# Patient Record
Sex: Female | Born: 1935 | Race: White | Hispanic: No | Marital: Single | Smoking: Former smoker
Health system: Southern US, Community
[De-identification: ages and names within clinical notes are randomized; demographics above are authoritative.]

## PROBLEM LIST (undated history)

## (undated) DIAGNOSIS — E119 Type 2 diabetes mellitus without complications: Secondary | ICD-10-CM

---

## 2016-03-17 NOTE — Nursing Note (Signed)
Nursing Discharge Summary - Text       Nursing Discharge Summary Entered On:  03/17/2016 16:28 EST    Performed On:  03/17/2016 16:27 EST by Daphine DeutscherMARTIN, RN, Apolinar JunesBRANDON               DC Information   Discharge To, Anticipated :   Home with family support   Mode of Discharge :   Ambulatory   Transportation :   Cab   Accompanied By :   Family member   Daphine DeutscherMARTIN, RN, Apolinar JunesBRANDON - 03/17/2016 16:27 EST

## 2021-01-30 ENCOUNTER — Inpatient Hospital Stay
Admission: EM | Admit: 2021-01-30 | Discharge: 2021-02-01 | DRG: 638 | Disposition: A | Discharge status: Skilled Nursing Facility | Payer: Medicare Other | Source: Skilled Nursing Facility | Attending: Internal Medicine | Admitting: Internal Medicine

## 2021-01-30 ENCOUNTER — Inpatient Hospital Stay: Payer: Medicare Other

## 2021-01-30 ENCOUNTER — Other Ambulatory Visit: Payer: Self-pay

## 2021-01-30 DIAGNOSIS — Z8249 Family history of ischemic heart disease and other diseases of the circulatory system: Secondary | ICD-10-CM

## 2021-01-30 DIAGNOSIS — E785 Hyperlipidemia, unspecified: Secondary | ICD-10-CM

## 2021-01-30 DIAGNOSIS — E782 Mixed hyperlipidemia: Secondary | ICD-10-CM

## 2021-01-30 DIAGNOSIS — Z79899 Other long term (current) drug therapy: Secondary | ICD-10-CM

## 2021-01-30 DIAGNOSIS — Z87891 Personal history of nicotine dependence: Secondary | ICD-10-CM | POA: Diagnosis not present

## 2021-01-30 DIAGNOSIS — R778 Other specified abnormalities of plasma proteins: Secondary | ICD-10-CM

## 2021-01-30 DIAGNOSIS — Z794 Long term (current) use of insulin: Secondary | ICD-10-CM

## 2021-01-30 DIAGNOSIS — F419 Anxiety disorder, unspecified: Secondary | ICD-10-CM | POA: Diagnosis present

## 2021-01-30 DIAGNOSIS — E1110 Type 2 diabetes mellitus with ketoacidosis without coma: Secondary | ICD-10-CM | POA: Diagnosis present

## 2021-01-30 DIAGNOSIS — E039 Hypothyroidism, unspecified: Secondary | ICD-10-CM | POA: Diagnosis present

## 2021-01-30 DIAGNOSIS — E871 Hypo-osmolality and hyponatremia: Secondary | ICD-10-CM | POA: Diagnosis present

## 2021-01-30 DIAGNOSIS — F32A Depression, unspecified: Secondary | ICD-10-CM | POA: Diagnosis present

## 2021-01-30 DIAGNOSIS — R7989 Other specified abnormal findings of blood chemistry: Secondary | ICD-10-CM | POA: Diagnosis present

## 2021-01-30 DIAGNOSIS — E1010 Type 1 diabetes mellitus with ketoacidosis without coma: Secondary | ICD-10-CM | POA: Diagnosis not present

## 2021-01-30 DIAGNOSIS — Z20822 Contact with and (suspected) exposure to covid-19: Secondary | ICD-10-CM | POA: Diagnosis present

## 2021-01-30 DIAGNOSIS — F0390 Unspecified dementia without behavioral disturbance: Secondary | ICD-10-CM | POA: Diagnosis present

## 2021-01-30 DIAGNOSIS — Z66 Do not resuscitate: Secondary | ICD-10-CM | POA: Diagnosis present

## 2021-01-30 DIAGNOSIS — I10 Essential (primary) hypertension: Secondary | ICD-10-CM

## 2021-01-30 DIAGNOSIS — J439 Emphysema, unspecified: Secondary | ICD-10-CM

## 2021-01-30 DIAGNOSIS — G4700 Insomnia, unspecified: Secondary | ICD-10-CM | POA: Diagnosis present

## 2021-01-30 DIAGNOSIS — J449 Chronic obstructive pulmonary disease, unspecified: Secondary | ICD-10-CM | POA: Diagnosis present

## 2021-01-30 DIAGNOSIS — N179 Acute kidney failure, unspecified: Secondary | ICD-10-CM | POA: Diagnosis present

## 2021-01-30 HISTORY — DX: Type 2 diabetes mellitus without complications: E11.9

## 2021-01-30 LAB — BASIC METABOLIC PANEL
Anion gap: 23 — ABNORMAL HIGH (ref 5–15)
Anion gap: 22 — ABNORMAL HIGH (ref 5–15)
Anion gap: 16 — ABNORMAL HIGH (ref 5–15)
Anion gap: 9 (ref 5–15)
Anion gap: 9 (ref 5–15)
BUN: 48 mg/dL — ABNORMAL HIGH (ref 8–23)
BUN: 47 mg/dL — ABNORMAL HIGH (ref 8–23)
BUN: 43 mg/dL — ABNORMAL HIGH (ref 8–23)
BUN: 39 mg/dL — ABNORMAL HIGH (ref 8–23)
BUN: 43 mg/dL — ABNORMAL HIGH (ref 8–23)
CO2: 12 mmol/L — ABNORMAL LOW (ref 22–32)
CO2: 13 mmol/L — ABNORMAL LOW (ref 22–32)
CO2: 17 mmol/L — ABNORMAL LOW (ref 22–32)
CO2: 20 mmol/L — ABNORMAL LOW (ref 22–32)
CO2: 23 mmol/L (ref 22–32)
Calcium: 8.3 mg/dL — ABNORMAL LOW (ref 8.9–10.3)
Calcium: 8.2 mg/dL — ABNORMAL LOW (ref 8.9–10.3)
Calcium: 7.7 mg/dL — ABNORMAL LOW (ref 8.9–10.3)
Calcium: 6.6 mg/dL — ABNORMAL LOW (ref 8.9–10.3)
Calcium: 7.8 mg/dL — ABNORMAL LOW (ref 8.9–10.3)
Chloride: 94 mmol/L — ABNORMAL LOW (ref 98–111)
Chloride: 95 mmol/L — ABNORMAL LOW (ref 98–111)
Chloride: 98 mmol/L (ref 98–111)
Chloride: 106 mmol/L (ref 98–111)
Chloride: 102 mmol/L (ref 98–111)
Creatinine, Ser: 2.9 mg/dL — ABNORMAL HIGH (ref 0.44–1.00)
Creatinine, Ser: 2.89 mg/dL — ABNORMAL HIGH (ref 0.44–1.00)
Creatinine, Ser: 2.44 mg/dL — ABNORMAL HIGH (ref 0.44–1.00)
Creatinine, Ser: 1.96 mg/dL — ABNORMAL HIGH (ref 0.44–1.00)
Creatinine, Ser: 2.27 mg/dL — ABNORMAL HIGH (ref 0.44–1.00)
GFR, Estimated: 15 mL/min — ABNORMAL LOW (ref >60)
GFR, Estimated: 15 mL/min — ABNORMAL LOW (ref >60)
GFR, Estimated: 18 mL/min — ABNORMAL LOW (ref >60)
GFR, Estimated: 24 mL/min — ABNORMAL LOW (ref >60)
GFR, Estimated: 20 mL/min — ABNORMAL LOW (ref >60)
Glucose, Bld: 765 mg/dL (ref 70–99)
Glucose, Bld: 722 mg/dL (ref 70–99)
Glucose, Bld: 497 mg/dL — ABNORMAL HIGH (ref 70–99)
Glucose, Bld: 310 mg/dL — ABNORMAL HIGH (ref 70–99)
Glucose, Bld: 177 mg/dL — ABNORMAL HIGH (ref 70–99)
Potassium: 4.5 mmol/L (ref 3.5–5.1)
Potassium: 4.3 mmol/L (ref 3.5–5.1)
Potassium: 4.4 mmol/L (ref 3.5–5.1)
Potassium: 3.5 mmol/L (ref 3.5–5.1)
Potassium: 4.3 mmol/L (ref 3.5–5.1)
Sodium: 129 mmol/L — ABNORMAL LOW (ref 135–145)
Sodium: 130 mmol/L — ABNORMAL LOW (ref 135–145)
Sodium: 131 mmol/L — ABNORMAL LOW (ref 135–145)
Sodium: 135 mmol/L (ref 135–145)
Sodium: 134 mmol/L — ABNORMAL LOW (ref 135–145)

## 2021-01-30 LAB — BETA-HYDROXYBUTYRIC ACID
Beta-Hydroxybutyric Acid: 6.18 mmol/L — ABNORMAL HIGH (ref 0.05–0.27)
Beta-Hydroxybutyric Acid: 0.3 mmol/L — ABNORMAL HIGH (ref 0.05–0.27)

## 2021-01-30 LAB — URINALYSIS, COMPLETE (UACMP) WITH MICROSCOPIC
Bacteria, UA: NONE SEEN
Bilirubin Urine: NEGATIVE
Glucose, UA: >=500 mg/dL — AB
Ketones, ur: 20 mg/dL — AB
Leukocytes,Ua: NEGATIVE
Nitrite: NEGATIVE
Protein, ur: 100 mg/dL — AB
Specific Gravity, Urine: 1.017 (ref 1.005–1.030)
pH: 5 (ref 5.0–8.0)

## 2021-01-30 LAB — BLOOD GAS, VENOUS
Acid-base deficit: 14.7 mmol/L — ABNORMAL HIGH (ref 0.0–2.0)
Bicarbonate: 12.8 mmol/L — ABNORMAL LOW (ref 20.0–28.0)
O2 Saturation: 63.6 %
Patient temperature: 37
pCO2, Ven: 35 mmHg — ABNORMAL LOW (ref 44.0–60.0)
pH, Ven: 7.17 — CL (ref 7.250–7.430)
pO2, Ven: 43 mmHg (ref 32.0–45.0)

## 2021-01-30 LAB — HEPATIC FUNCTION PANEL
ALT: 15 U/L (ref 0–44)
AST: 27 U/L (ref 15–41)
Albumin: 3.7 g/dL (ref 3.5–5.0)
Alkaline Phosphatase: 80 U/L (ref 38–126)
Bilirubin, Direct: <0.1 mg/dL (ref 0.0–0.2)
Total Bilirubin: 1.6 mg/dL — ABNORMAL HIGH (ref 0.3–1.2)
Total Protein: 6.7 g/dL (ref 6.5–8.1)

## 2021-01-30 LAB — CBG MONITORING, ED
Glucose-Capillary: >600 mg/dL (ref 70–99)
Glucose-Capillary: >600 mg/dL (ref 70–99)
Glucose-Capillary: >600 mg/dL (ref 70–99)
Glucose-Capillary: 488 mg/dL — ABNORMAL HIGH (ref 70–99)
Glucose-Capillary: 411 mg/dL — ABNORMAL HIGH (ref 70–99)
Glucose-Capillary: 414 mg/dL — ABNORMAL HIGH (ref 70–99)
Glucose-Capillary: 361 mg/dL — ABNORMAL HIGH (ref 70–99)
Glucose-Capillary: 255 mg/dL — ABNORMAL HIGH (ref 70–99)
Glucose-Capillary: 200 mg/dL — ABNORMAL HIGH (ref 70–99)
Glucose-Capillary: 184 mg/dL — ABNORMAL HIGH (ref 70–99)
Glucose-Capillary: 161 mg/dL — ABNORMAL HIGH (ref 70–99)

## 2021-01-30 LAB — RESP PANEL BY RT-PCR (FLU A&B, COVID) ARPGX2
Influenza A by PCR: NEGATIVE
Influenza B by PCR: NEGATIVE
SARS Coronavirus 2 by RT PCR: NEGATIVE

## 2021-01-30 LAB — CBC
HCT: 35.9 % — ABNORMAL LOW (ref 36.0–46.0)
Hemoglobin: 11.8 g/dL — ABNORMAL LOW (ref 12.0–15.0)
MCH: 29.8 pg (ref 26.0–34.0)
MCHC: 32.9 g/dL (ref 30.0–36.0)
MCV: 90.7 fL (ref 80.0–100.0)
Platelets: 255 10*3/uL (ref 150–400)
RBC: 3.96 MIL/uL (ref 3.87–5.11)
RDW: 13 % (ref 11.5–15.5)
WBC: 12.9 10*3/uL — ABNORMAL HIGH (ref 4.0–10.5)
nRBC: 0 % (ref 0.0–0.2)

## 2021-01-30 LAB — LIPASE, BLOOD: Lipase: 45 U/L (ref 11–51)

## 2021-01-30 LAB — LACTIC ACID, PLASMA
Lactic Acid, Venous: 4.3 mmol/L (ref 0.5–1.9)
Lactic Acid, Venous: 1.3 mmol/L (ref 0.5–1.9)

## 2021-01-30 LAB — TROPONIN I (HIGH SENSITIVITY)
Troponin I (High Sensitivity): 21 ng/L — ABNORMAL HIGH (ref <18)
Troponin I (High Sensitivity): 26 ng/L — ABNORMAL HIGH (ref <18)

## 2021-01-30 LAB — TSH: TSH: 3.697 u[IU]/mL (ref 0.350–4.500)

## 2021-01-30 MED ORDER — MEMANTINE HCL 5 MG PO TABS
10.0000 mg | ORAL_TABLET | Freq: Two times a day (BID) | ORAL | Status: DC
  Administered 2021-01-30 – 2021-02-01 (×4): 10 mg via ORAL
  Filled 2021-01-30 (×4): qty 2

## 2021-01-30 MED ORDER — INSULIN ASPART 100 UNIT/ML IJ SOLN
0.0000 [IU] | Freq: Three times a day (TID) | INTRAMUSCULAR | Status: DC
  Administered 2021-01-31: 3 [IU] via SUBCUTANEOUS
  Administered 2021-01-31: 2 [IU] via SUBCUTANEOUS
  Administered 2021-01-31: 3 [IU] via SUBCUTANEOUS
  Administered 2021-01-31: 2 [IU] via SUBCUTANEOUS
  Filled 2021-01-30 (×4): qty 1

## 2021-01-30 MED ORDER — LACTATED RINGERS IV BOLUS
1000.0000 mL | Freq: Once | INTRAVENOUS | Status: AC
  Administered 2021-01-30: 1000 mL via INTRAVENOUS

## 2021-01-30 MED ORDER — SODIUM CHLORIDE 0.9 % IV SOLN: INTRAVENOUS | Status: DC

## 2021-01-30 MED ORDER — LEVOTHYROXINE SODIUM 50 MCG PO TABS
125.0000 ug | ORAL_TABLET | Freq: Every day | ORAL | Status: DC
  Administered 2021-01-31 – 2021-02-01 (×2): 125 ug via ORAL
  Filled 2021-01-30: qty 1
  Filled 2021-01-30: qty 3

## 2021-01-30 MED ORDER — HYDRALAZINE HCL 20 MG/ML IJ SOLN
5.0000 mg | Freq: Four times a day (QID) | INTRAMUSCULAR | Status: DC | PRN
Start: 2021-01-30 — End: 2021-02-01

## 2021-01-30 MED ORDER — INSULIN GLARGINE-YFGN 100 UNIT/ML ~~LOC~~ SOLN
10.0000 [IU] | Freq: Every day | SUBCUTANEOUS | Status: DC
  Administered 2021-01-31 (×2): 10 [IU] via SUBCUTANEOUS
  Filled 2021-01-30 (×3): qty 0.1

## 2021-01-30 MED ORDER — SERTRALINE HCL 50 MG PO TABS
25.0000 mg | ORAL_TABLET | Freq: Every day | ORAL | Status: DC
  Administered 2021-01-30 – 2021-01-31 (×2): 25 mg via ORAL
  Filled 2021-01-30 (×2): qty 1

## 2021-01-30 MED ORDER — DEXTROSE IN LACTATED RINGERS 5 % IV SOLN: INTRAVENOUS | Status: DC

## 2021-01-30 MED ORDER — INSULIN REGULAR(HUMAN) IN NACL 100-0.9 UT/100ML-% IV SOLN
INTRAVENOUS | Status: DC
  Administered 2021-01-30: 6 [IU]/h via INTRAVENOUS
  Filled 2021-01-30: qty 100

## 2021-01-30 MED ORDER — SODIUM CHLORIDE 0.9 % IV BOLUS
1000.0000 mL | Freq: Once | INTRAVENOUS | Status: AC
  Administered 2021-01-31: 1000 mL via INTRAVENOUS

## 2021-01-30 MED ORDER — DEXTROSE 50 % IV SOLN: 0.0000 mL | INTRAVENOUS | Status: DC | PRN

## 2021-01-30 MED ORDER — LACTATED RINGERS IV SOLN: INTRAVENOUS | Status: DC

## 2021-01-30 MED ORDER — SENNOSIDES-DOCUSATE SODIUM 8.6-50 MG PO TABS
1.0000 | ORAL_TABLET | Freq: Two times a day (BID) | ORAL | Status: DC
  Administered 2021-01-30 – 2021-02-01 (×4): 1 via ORAL
  Filled 2021-01-30 (×4): qty 1

## 2021-01-30 MED ORDER — HEPARIN SODIUM (PORCINE) 5000 UNIT/ML IJ SOLN
5000.0000 [IU] | Freq: Three times a day (TID) | INTRAMUSCULAR | Status: DC
  Administered 2021-01-30 – 2021-02-01 (×6): 5000 [IU] via SUBCUTANEOUS
  Filled 2021-01-30 (×6): qty 1

## 2021-01-30 MED ORDER — POTASSIUM CHLORIDE 10 MEQ/100ML IV SOLN
10.0000 meq | INTRAVENOUS | Status: AC
Start: 2021-01-30 — End: 2021-01-30
  Administered 2021-01-30: 10 meq via INTRAVENOUS
  Filled 2021-01-30: qty 100

## 2021-01-30 MED ORDER — DONEPEZIL HCL 5 MG PO TABS
10.0000 mg | ORAL_TABLET | Freq: Every day | ORAL | Status: DC
  Administered 2021-01-30 – 2021-01-31 (×2): 10 mg via ORAL
  Filled 2021-01-30 (×3): qty 2

## 2021-01-30 MED ORDER — MELATONIN 5 MG PO TABS
10.0000 mg | ORAL_TABLET | Freq: Every day | ORAL | Status: DC
  Administered 2021-01-30 – 2021-01-31 (×2): 10 mg via ORAL
  Filled 2021-01-30 (×2): qty 2

## 2021-01-30 NOTE — ED Triage Notes (Signed)
Pt comes into the ED via EMS from [facility] with c/o CBG reading HIGH, they gave her 15 lantus and then 16 of novolog and it was still reading HIGH Pt c/o weakness, chills and vomiting today. Fine per staff yesterday #[id number] NS infusing 156/98 98%RA 99HR

## 2021-01-30 NOTE — ED Notes (Signed)
Called lab to verify Beta hydroxy levels for this pt , no results found in Epic . No results given from lab, though orders noted completed.. will send sample to lab

## 2021-01-30 NOTE — ED Notes (Signed)
Pt ambulatory to toilet in rm with this tech and Melanie, EDT at her side. Pt urinated at this time. Hand hygiene preformed. Pt ambulatory back to bed and placed back on cardiac monitor. Pt has no further needs at this time. Call light within reach.

## 2021-01-30 NOTE — ED Provider Notes (Signed)
Emergency Medicine Provider Triage Evaluation Note  Annaclara Search , a 87 y.o. female  was evaluated in triage. She arrives by EMS for high blood sugar. She is unsure why she is here. She denies complaints except feeling thirsty.  Review of Systems  Positive: Increased thirst Negative: Chest pain, shortness of breath  Physical Exam  There were no vitals taken for this visit. Gen:   Awake, no distress   Resp:  Normal effort  MSK:   Moves extremities without difficulty  Other:    Medical Decision Making  Medically screening exam initiated at 1:11 PM.  Appropriate orders placed.  Advita Halliwill was informed that the remainder of the evaluation will be completed by another provider, this initial triage assessment does not replace that evaluation, and the importance of remaining in the ED until their evaluation is complete.   Victorino Dike, FNP 01/30/21 1317    Lucrezia Starch, MD 01/30/21 1409

## 2021-01-30 NOTE — ED Provider Notes (Signed)
[facility] Emergency Department Provider Note  ____________________________________________   Event Date/Time   First MD Initiated Contact with Patient 01/30/21 1311     (approximate)  I have reviewed the triage vital signs and the nursing notes.   HISTORY  Chief Complaint Hyperglycemia   HPI Katelyn Arroyo is a 87 y.o. female with a past medical history of type 1 diabetes, CKD, COPD and HDL who presents EMS from a nursing facility for evaluation of some nonbloody nonbilious nausea and vomiting that started today as well as high sugars.  Patient reportedly received 15 units of Lantus and 16 of NovoLog but her fingerstick glucose was still running high at facility. Received 500s of normal saline via EMS prior to arrival.  Patient states she cannot member when she started feeling poorly but denies any current nausea, chest pain, headache, earache, sore throat, cough, back pain or any other acute sick symptoms.  She has not oriented and further history is somewhat limited secondary to dementia.  I did speak with her daughter and seems he recently moved to the area from [place] patient has had issues with her sugars in the past.  Daughter reports symptoms all started today and she seemed to be in her usual state health yesterday.  She confirms that patient does not normally oriented secondary to dementia.         Past Medical History:  Diagnosis Date   Diabetes mellitus without complication [facility])     Patient Active Problem List   Diagnosis Date Noted   DKA (diabetic ketoacidosis) ([place]) 01/30/2021   Essential hypertension 01/30/2021   Dementia ([place]) 01/30/2021   COPD (chronic obstructive pulmonary disease) ([place]) 01/30/2021   Hyperlipidemia 01/30/2021    History reviewed. No pertinent surgical history.  Prior to Admission medications   Not on File    Allergies Ceftriaxone  No family history on file.  Social History Social History   Tobacco Use    Smoking status: Former    Types: Cigarettes   Smokeless tobacco: Never    Review of Systems  Review of Systems  Unable to perform ROS: Dementia  Gastrointestinal:  Positive for nausea and vomiting.     ____________________________________________   PHYSICAL EXAM:  VITAL SIGNS: ED Triage Vitals  Enc Vitals Group     BP 01/30/21 1315 (!) 140/48     Pulse Rate 01/30/21 1315 91     Resp 01/30/21 1315 16     Temp 01/30/21 1315 97.8 F (36.6 C)     Temp Source 01/30/21 1315 Oral     SpO2 01/30/21 1315 99 %     Weight 01/30/21 1315 150 lb (68 kg)     Height --      Head Circumference --      Peak Flow --      Pain Score 01/30/21 1304 0     Pain Loc --      Pain Edu? --      Excl. in [place]? --    Vitals:   01/30/21 1335 01/30/21 1400  BP: (!) 152/67 (!) 152/70  Pulse: 93 93  Resp: (!) 21 14  Temp:    SpO2: 100% 100%   Physical Exam Vitals and nursing note reviewed.  Constitutional:      General: She is not in acute distress.    Appearance: She is well-developed.  HENT:     Head: Normocephalic and atraumatic.     Right Ear: External ear normal.  Left Ear: External ear normal.     Nose: Nose normal.     Mouth/Throat:     Mouth: Mucous membranes are dry.  Eyes:     Conjunctiva/sclera: Conjunctivae normal.  Cardiovascular:     Rate and Rhythm: Normal rate and regular rhythm.     Heart sounds: No murmur heard. Pulmonary:     Effort: Pulmonary effort is normal. No respiratory distress.     Breath sounds: Normal breath sounds.  Abdominal:     Palpations: Abdomen is soft.     Tenderness: There is no abdominal tenderness.  Musculoskeletal:     Cervical back: Neck supple.  Skin:    General: Skin is warm and dry.     Capillary Refill: Capillary refill takes more than 3 seconds.  Neurological:     Mental Status: She is alert. Mental status is at baseline. She is disoriented and confused.  Psychiatric:        Mood and Affect: Mood normal.    Patient follows  commands in all extremities.  Cranial nerves II through XII grossly intact. ____________________________________________   LABS (all labs ordered are listed, but only abnormal results are displayed)  Labs Reviewed  BASIC METABOLIC PANEL - Abnormal; Notable for the following components:      Result Value   Sodium 129 (*)    Chloride 94 (*)    CO2 12 (*)    Glucose, Bld 765 (*)    BUN 48 (*)    Creatinine, Ser 2.90 (*)    Calcium 8.3 (*)    GFR, Estimated 15 (*)    Anion gap 23 (*)    All other components within normal limits  CBC - Abnormal; Notable for the following components:   WBC 12.9 (*)    Hemoglobin 11.8 (*)    HCT 35.9 (*)    All other components within normal limits  URINALYSIS, COMPLETE (UACMP) WITH MICROSCOPIC - Abnormal; Notable for the following components:   Color, Urine STRAW (*)    APPearance CLEAR (*)    Glucose, UA >=500 (*)    Hgb urine dipstick SMALL (*)    Ketones, ur 20 (*)    Protein, ur 100 (*)    All other components within normal limits  BLOOD GAS, VENOUS - Abnormal; Notable for the following components:   pH, Ven 7.17 (*)    pCO2, Ven 35 (*)    Bicarbonate 12.8 (*)    Acid-base deficit 14.7 (*)    All other components within normal limits  HEPATIC FUNCTION PANEL - Abnormal; Notable for the following components:   Total Bilirubin 1.6 (*)    All other components within normal limits  BASIC METABOLIC PANEL - Abnormal; Notable for the following components:   Sodium 130 (*)    Chloride 95 (*)    CO2 13 (*)    Glucose, Bld 722 (*)    BUN 47 (*)    Creatinine, Ser 2.89 (*)    Calcium 8.2 (*)    GFR, Estimated 15 (*)    Anion gap 22 (*)    All other components within normal limits  CBG MONITORING, ED - Abnormal; Notable for the following components:   Glucose-Capillary >600 (*)    All other components within normal limits  TROPONIN I (HIGH SENSITIVITY) - Abnormal; Notable for the following components:   Troponin I (High Sensitivity) 21 (*)     All other components within normal limits  RESP PANEL BY RT-PCR (FLU A&B, COVID) ARPGX2  LIPASE, BLOOD  BETA-HYDROXYBUTYRIC ACID  LACTIC ACID, PLASMA  LACTIC ACID, PLASMA  BASIC METABOLIC PANEL  BASIC METABOLIC PANEL  BASIC METABOLIC PANEL  BASIC METABOLIC PANEL  BASIC METABOLIC PANEL  BASIC METABOLIC PANEL  BASIC METABOLIC PANEL  BETA-HYDROXYBUTYRIC ACID  BETA-HYDROXYBUTYRIC ACID  HEMOGLOBIN A1C  CBG MONITORING, ED   ____________________________________________  EKG  ECG has significant artifact and appears to be sinus with possible multi focal atrial P waves with abnormal intervals and some nonspecific changes in inferior leads. ____________________________________________  [place]  ED MD interpretation:    Official radiology report(s): No results found.  ____________________________________________   PROCEDURES  Procedure(s) performed (including Critical Care):  .Critical Care Performed by: Lucrezia Starch, MD Authorized by: Lucrezia Starch, MD   Critical care provider statement:    Critical care time (minutes):  45   Critical care was necessary to treat or prevent imminent or life-threatening deterioration of the following conditions:  Endocrine crisis and metabolic crisis   Critical care was time spent personally by me on the following activities:  Discussions with consultants, evaluation of patient's response to treatment, examination of patient, ordering and performing treatments and interventions, ordering and review of laboratory studies, ordering and review of radiographic studies, pulse oximetry, re-evaluation of patient's condition, obtaining history from patient or surrogate and review of old charts   ____________________________________________   INITIAL IMPRESSION / [place] / ED COURSE      Patient presents with above-stated history exam of developing some nausea and vomiting this morning and has appears to glucose measurements  reading "high" but did not improve with her sliding scale insulin and Lantus.  On arrival she is afebrile and hemodynamically stable.  She is confused but has a nonfocal neurological exam.  She does appear dehydrated.  Abdomen is soft nontender throughout and her lungs are clear bilaterally.  "On reassessment she states she is not sure why she is here.  Fingerstick arrival shows glucose of greater than 600.  Differential includes DKA, HHS possible precipitated by acute infectious process i.e. infectious gastroenteritis.  I will obtain an EKG to assess for evidence of atypical presentation for ACS as well.  ECG has some nonspecific findings versus artifact and patient is not endorsing any chest pain at this time we will plan to obtain a troponin.   BMP remarkable for sodium of 129, bicarb of 12, glucose of 765, BUN of 48 and a creatinine of 2.9 with an anion gap of 23.  CBC shows slight leukocytosis with WBC count of 12.9 without evidence of significant anemia and platelets are normal.  Hepatic function panel is unremarkable and there is no evidence of cholestasis or hepatitis and I have low suspicion for cholecystitis the patient has no significant tenderness on exam.  VBG with a pH of 7.17 with a PCO2 of 35 and a bicarb of 12.8 which combined with BMP is consistent with DKA.  This is further supported by UA which is ketones.  No evidence on UA of urinary source of infection.  Lipase not consistent with acute pancreatitis.  Troponin is elevated 2100 suspect represents a mild demand ischemia Evalose patient for ACS at this time.  We will plan to trend this.   Patient started on insulin protocol.  I will plan to admit to medicine service for further evaluation and management.      ____________________________________________   FINAL CLINICAL IMPRESSION(S) / ED DIAGNOSES  Final diagnoses:  Diabetic ketoacidosis without coma associated with type 1 diabetes mellitus (  [place])  Troponin I above reference  range    Medications  insulin regular, human (MYXREDLIN) 100 units/ 100 mL infusion (6 Units/hr Intravenous New Bag/Given 01/30/21 1438)  lactated ringers infusion (has no administration in time range)  dextrose 5 % in lactated ringers infusion (has no administration in time range)  dextrose 50 % solution 0-50 mL (has no administration in time range)  potassium chloride 10 mEq in 100 mL IVPB (10 mEq Intravenous New Bag/Given 01/30/21 1439)  heparin injection 5,000 Units (has no administration in time range)  lactated ringers bolus 1,000 mL (1,000 mLs Intravenous New Bag/Given 01/30/21 1341)     ED Discharge Orders     None        Note:  This document was prepared using Dragon voice recognition software and may include unintentional dictation errors.    Lucrezia Starch, MD 01/30/21 [phone]

## 2021-01-30 NOTE — H&P (Signed)
History and Physical   Katelyn Arroyo [id number] DOB: 01-11-1929 DOA: 01/30/2021  PCP: System, Provider Not In  Patient coming from: [facility]  I have personally briefly reviewed patient's old medical records in [place].  Chief Concern: High blood sugar  HPI: Katelyn Arroyo is a 87 y.o. female with medical history significant for insulin-dependent diabetes mellitus, hypothyroid, depression/anxiety, dementia, hypothyroid, presents to the emergency department for chief concerns of high blood sugar from facility.  At bedside was able to tell me her name, the current location is hospital.  She was not able to tell me her age, current calendar year nor the current president.  She is very pleasant and does not appear to be in acute distress.  She kept asking me why she in the hospital despite multiple answers.  She does note that her only child is Katelyn, her daughter.  She states that she feels fine and denies chest pain, shortness of breath, abdominal pain.  She states that she has not had any thing to eat.  However she states she does not feel hungry.  And then she states that she does not know if she is had anything to eat.  Per daughter, patient has been at [facility] for less than 1 week.  Patient has advanced dementia and her medications are administered by facility staff.   She went out to eat lunch with grandson on 01/29/21 at a [place], patient had a taco salad. Per daughter, patient was in normal state of health and had no complaints.   Social history: She is a former tobacco user, and she quit smoking about 20 years ago. She currently does not drink etoh. She is retired and formerly worked in Conservation officer, historic buildings work.   Vaccination history: She is vaccinated for covid 19, two doses Moderna  ROS: Unable to complete due to patient with advance dementia  ED Course: Discussed with emergency medicine provider, patient requiring hospitalization for chief concerns of  DKA.  Vitals in the emergency department was remarkable for temperature 97.8, respiration rate of 14, heart rate of 93, blood pressure 152/70, SPO2 100% on room air.  Labs in the emergency department was remarkable for sodium 129, potassium 4.5, chloride 93, bicarb 12, BUN 48, serum creatinine of 2.90, nonfasting blood glucose 765, GFR 15, WBC 12.9, hemoglobin 11.8, platelets 255.  COVID/influenza A/influenza B PCR were negative.  High sensitive troponin was 21.  EDP started patient on insulin GTT and lactated ringer 1 L bolus.  Assessment/Plan  Principal Problem:   DKA (diabetic ketoacidosis) ([place]) Active Problems:   Essential hypertension   Dementia (H[facility])   COPD (chronic obstructive pulmonary disease) ([facility])   Hyperlipidemia   Elevated troponin   # DKA with gap of 23 on admission - Etiology work-up in progress - Admit to stepdown, inpatient, telemetry - Continue insulin GTT - Check beta hydroxy butyrate - BMP every 4 hours - Check portable chest x-ray, continue to monitor troponins  # Hyponatremia secondary to hyperglycemia - Correction sodium is 140 - Status post lactated ringer 1 L bolus per EDP - Continue lactated ringer infusion at 125 mL/h until CBG less than 250 - Continue order of dextrose 5% LR at 125 mL/h for CBG less than 250 - Check portable chest x-ray  # Elevated troponin-low clinical suspicion for ACS, I suspect this is secondary to DKA - Patient not have chest pain and there was no ST elevation on EKG - No prior EKG for further comparison  # Acute kidney injury  versus CKD 4 - No prior labs available in EHR and no access to care everywhere - Serum creatinine on presentation is 2.90, GFR 15 - BMP recheck as above  # Mild hypertension-no prior history - Given the patient's age is 54, we will have higher threshold for hypertension - Hydralazine 5 mg IV every 6 hours as needed for SBP greater than 170, 4 doses ordered  # History of dementia - recently  moved to [place] from [place] home memantine 10 mg p.o. twice daily, donepezil 10 mg nightly - Fall precautions  # Depression/anxiety-resumed home sertraline 25 mg p.o. nightly # Insomnia-resumed home melatonin 10 mg nightly  Med reconciliation completed with the aid of a pharmacy tech  Chart reviewed.   DVT prophylaxis: Heparin 5000 units every 8 hours Code Status: DNR/DNI  Diet: N.p.o. Family Communication: Attempted to call daughter, Katelyn Arroyo, at [phone], my call went directly to voicemail Disposition Plan: Pending clinical course Consults called: None at this time Admission status: Stepdown, inpatient, telemetry  Past Medical History:  Diagnosis Date   Diabetes mellitus without complication ([place])    History reviewed. No pertinent surgical history.  Social History:  reports that she has quit smoking. Her smoking use included cigarettes. She has never used smokeless tobacco. No history on file for alcohol use and drug use.  Allergies  Allergen Reactions   Ceftriaxone    Family History  Problem Relation Age of Onset   Heart disease Father    Heart disease Other    Family history: Family history reviewed and not pertinent.  Prior to Admission medications   Memantine 10 mg twice daily, levothyroxine 125 mcg before breakfast daily   Physical Exam: Vitals:   01/30/21 1530 01/30/21 1600 01/30/21 1630 01/30/21 1700  BP: (!) 151/80 (!) 154/75 128/71 (!) 125/57  Pulse: 79 79 88 77  Resp: '18 17 15 18  '$ Temp:      TempSrc:      SpO2: 100% 100% 98% 98%  Weight:       Constitutional: appears age-appropriate, NAD, calm, comfortable Eyes: PERRL, lids and conjunctivae normal ENMT: Mucous membranes are moist. Posterior pharynx clear of any exudate or lesions. Age-appropriate dentition. Hearing appropriate/loss Neck: normal, supple, no masses, no thyromegaly Respiratory: clear to auscultation bilaterally, no wheezing, no crackles. Normal respiratory  effort. No accessory muscle use.  Cardiovascular: Regular rate and rhythm, no murmurs / rubs / gallops. No extremity edema. 2+ pedal pulses. No carotid bruits.  Abdomen: no tenderness, no masses palpated, no hepatosplenomegaly. Bowel sounds positive.  Musculoskeletal: no clubbing / cyanosis. No joint deformity upper and lower extremities. Good ROM, no contractures, no atrophy. Normal muscle tone.  Skin: no rashes, lesions, ulcers. No induration Neurologic: Sensation intact. Strength 5/5 in all 4.  Psychiatric: Patient has judgment and insight in setting of chronic, known dementia. Alert and oriented to self and location. Normal mood.   EKG: independently reviewed, showing sinus rhythm with rate of 98, QTc 455  Chest x-ray on Admission: I personally reviewed and I agree with radiologist reading as below.  Portable chest x-ray (1 view)  Result Date: 01/30/2021 CLINICAL DATA:  Hyperglycemia and DKA EXAM: PORTABLE CHEST 1 VIEW COMPARISON:  None. FINDINGS: Cardiac shadow is within normal limits. Aortic calcifications are noted. Lungs are well aerated bilaterally. Multiple calcified granulomas are seen bilaterally. No focal infiltrate or effusion is noted. Changes of prior vertebral augmentation are seen. IMPRESSION: No acute abnormality noted. Changes of prior granulomatous disease are seen. Electronically  Signed   By: Inez Catalina M.D.   On: 01/30/2021 15:30    Labs on Admission: I have personally reviewed following labs  CBC: Recent Labs  Lab 01/30/21 1314  WBC 12.9*  HGB 11.8*  HCT 35.9*  MCV 90.7  PLT 123456   Basic Metabolic Panel: Recent Labs  Lab 01/30/21 1314 01/30/21 1332 01/30/21 1545  NA 129* 130* 131*  K 4.5 4.3 4.4  CL 94* 95* 98  CO2 12* 13* 17*  GLUCOSE 765* 722* 497*  BUN 48* 47* 43*  CREATININE 2.90* 2.89* 2.44*  CALCIUM 8.3* 8.2* 7.7*   GFR: CrCl cannot be calculated (Unknown ideal weight.).  Liver Function Tests: Recent Labs  Lab 01/30/21 1314  AST 27   ALT 15  ALKPHOS 80  BILITOT 1.6*  PROT 6.7  ALBUMIN 3.7   Recent Labs  Lab 01/30/21 1332  LIPASE 45   CBG: Recent Labs  Lab 01/30/21 1313 01/30/21 1428 01/30/21 1500 01/30/21 1547 01/30/21 1632  GLUCAP >600* >600* >600* 488* 414*   Urine analysis:    Component Value Date/Time   COLORURINE STRAW (A) 01/30/2021 1332   APPEARANCEUR CLEAR (A) 01/30/2021 1332   LABSPEC 1.017 01/30/2021 1332   PHURINE 5.0 01/30/2021 1332   GLUCOSEU >=500 (A) 01/30/2021 1332   HGBUR SMALL (A) 01/30/2021 1332   BILIRUBINUR NEGATIVE 01/30/2021 1332   KETONESUR 20 (A) 01/30/2021 1332   PROTEINUR 100 (A) 01/30/2021 1332   NITRITE NEGATIVE 01/30/2021 1332   LEUKOCYTESUR NEGATIVE 01/30/2021 1332   Dr. Tobie Poet [facility]  If 7PM-7AM, please contact overnight-coverage provider If 7AM-7PM, please contact day coverage provider [email or web address]  01/30/2021, 5:30 PM

## 2021-01-30 NOTE — ED Notes (Signed)
Pt is alert, ambulatory without assistance. Pt is oriented to self only- asks for reorientation frequently. NAD noted. Fruity breath odor noted.

## 2021-01-31 ENCOUNTER — Encounter: Payer: Self-pay | Admitting: Internal Medicine

## 2021-01-31 LAB — BASIC METABOLIC PANEL
Anion gap: 12 (ref 5–15)
BUN: 43 mg/dL — ABNORMAL HIGH (ref 8–23)
CO2: 21 mmol/L — ABNORMAL LOW (ref 22–32)
Calcium: 8.2 mg/dL — ABNORMAL LOW (ref 8.9–10.3)
Chloride: 100 mmol/L (ref 98–111)
Creatinine, Ser: 2.16 mg/dL — ABNORMAL HIGH (ref 0.44–1.00)
GFR, Estimated: 21 mL/min — ABNORMAL LOW (ref >60)
Glucose, Bld: 164 mg/dL — ABNORMAL HIGH (ref 70–99)
Potassium: 4.4 mmol/L (ref 3.5–5.1)
Sodium: 133 mmol/L — ABNORMAL LOW (ref 135–145)

## 2021-01-31 LAB — GLUCOSE, CAPILLARY
Glucose-Capillary: 173 mg/dL — ABNORMAL HIGH (ref 70–99)
Glucose-Capillary: 148 mg/dL — ABNORMAL HIGH (ref 70–99)

## 2021-01-31 LAB — CBG MONITORING, ED
Glucose-Capillary: 128 mg/dL — ABNORMAL HIGH (ref 70–99)
Glucose-Capillary: 129 mg/dL — ABNORMAL HIGH (ref 70–99)
Glucose-Capillary: 149 mg/dL — ABNORMAL HIGH (ref 70–99)
Glucose-Capillary: 153 mg/dL — ABNORMAL HIGH (ref 70–99)
Glucose-Capillary: 163 mg/dL — ABNORMAL HIGH (ref 70–99)

## 2021-01-31 LAB — MAGNESIUM: Magnesium: 1.8 mg/dL (ref 1.7–2.4)

## 2021-01-31 LAB — HEMOGLOBIN A1C
Hgb A1c MFr Bld: 9.1 % — ABNORMAL HIGH (ref 4.8–5.6)
Mean Plasma Glucose: 214 mg/dL

## 2021-01-31 LAB — ALBUMIN: Albumin: 3 g/dL — ABNORMAL LOW (ref 3.5–5.0)

## 2021-01-31 MED ORDER — MAGNESIUM SULFATE IN D5W 1-5 GM/100ML-% IV SOLN
1.0000 g | Freq: Once | INTRAVENOUS | Status: AC
  Administered 2021-01-31: 1 g via INTRAVENOUS
  Filled 2021-01-31: qty 100

## 2021-01-31 NOTE — ED Notes (Signed)
Pt brief slightly wet, clean brief placed on pt and purewick adjusted

## 2021-01-31 NOTE — Progress Notes (Signed)
Inpatient Diabetes Program Recommendations  AACE/ADA: New Consensus Statement on Inpatient Glycemic Control   Target Ranges:  Prepandial:   less than 140 mg/dL      Peak postprandial:   less than 180 mg/dL (1-2 hours)      Critically ill patients:  140 - 180 mg/dL   Results for JAZAYA, HASSER (MRN [id number]) as of 01/31/2021 07:37  Ref. Range 01/31/2021 00:03 01/31/2021 02:05 01/31/2021 06:00  Glucose-Capillary Latest Ref Range: 70 - 99 mg/dL 163 (H)    Semglee 10 units @ 00:47 128 (H) 149 (H)  Novolog 2 units  Results for JUNKO, LONGAKER (MRN [id number]) as of 01/31/2021 07:37  Ref. Range 01/30/2021 13:14  Beta-Hydroxybutyric Acid Latest Ref Range: 0.05 - 0.27 mmol/L 6.18 (H)  Glucose Latest Ref Range: 70 - 99 mg/dL 765 (HH)  Hemoglobin A1C Latest Ref Range: 4.8 - 5.6 % 9.1 (H)   Review of Glycemic Control  Diabetes history: DM Outpatient Diabetes medications: Lantus 10 units daily, Novolog 4-10 units TID with meals Current orders for Inpatient glycemic control: Semglee 10 units QHS, Novolog 0-15 units AC&HS  Inpatient Diabetes Program Recommendations:    Insulin: Patient was initially ordered IV insulin for DKA and has been transitioned to SQ insulin. Patient received Semglee 10 units at 00:47 today. Agree with current insulin orders.  NOTE: Noted consult for diabetes coordinator. Chart reviewed. Per chart, patient is from [place] and has dementia. Patient was brought to the hospital  via EMS for hyperglycemia. Initial glucose 765 mg/dl and IV insulin per Endotool for DKA was started. Patient has been transitioned to SQ insulin and most current glucose 149 mg/dl at 6:00 am today. Agree with current insulin orders. Will follow along while inpatient.  Thanks, Barnie Alderman, RN, MSN, CDE Diabetes Coordinator Inpatient Diabetes Program [phone] (Team Pager from 8am to 5pm)

## 2021-01-31 NOTE — ED Notes (Signed)
Pt awake and does not report any further nausea or vomiting.

## 2021-01-31 NOTE — Progress Notes (Signed)
PROGRESS NOTE    Katelyn Arroyo  [id number] DOB: 04-27-1929 DOA: 01/30/2021 PCP: System, Provider Not In    Brief Narrative: 87 y.o. female with medical history significant for insulin-dependent diabetes mellitus, hypothyroid, depression/anxiety, dementia, hypothyroid, presents to the emergency department for chief concerns of high blood sugar from facility.   At bedside was able to tell me her name, the current location is hospital.  She was not able to tell me her age, current calendar year nor the current president.  She is very pleasant and does not appear to be in acute distress.   She kept asking me why she in the hospital despite multiple answers.  She does note that her only child is Trixie, her daughter.   She states that she feels fine and denies chest pain, shortness of breath, abdominal pain.  She states that she has not had any thing to eat.  However she states she does not feel hungry.  And then she states that she does not know if she is had anything to eat.   Per daughter, patient has been at [facility] for less than 1 week.  Patient has advanced dementia and her medications are administered by facility staff.    She went out to eat lunch with grandson on 01/29/21 at a [place], patient had a taco salad. Per daughter, patient was in normal state of health and had no complaints.   Patient presented with glucose greater than 765 and laboratory data consistent with DKA with elevated anion gap.  Started on insulin gtt. via Endo tool protocol.  Anion gap closed and patient was transitioned to subcutaneous regimen with good result.   Assessment & Plan:   Principal Problem:   DKA (diabetic ketoacidosis) ([place]) Active Problems:   Essential hypertension   Dementia (HCC)   COPD (chronic obstructive pulmonary disease) (HCC)   Hyperlipidemia   Elevated troponin  # DKA with gap of 23 on admission Etiology unclear No evidence of infection DKA resolved with Endo tool  protocol Transition to subcutaneous regimen Plan: Basal bolus subcutaneous regimen Carb modified diet Diabetes coordinator consult    # Hyponatremia secondary to hyperglycemia - Correction sodium is 140 - Status post lactated ringer 1 L bolus per EDP Discontinue IV fluids at this time as patient is tolerating p.o.   # Elevated troponin low clinical suspicion for ACS, I suspect this is secondary to DKA - Patient not have chest pain and there was no ST elevation on EKG - No prior EKG for further comparison   # Acute kidney injury versus CKD 4 - No prior labs available in EHR and no access to care everywhere - Serum creatinine on presentation is 2.90, GFR 15 - Monitor daily BMP   # Mild hypertension-no prior history - Given the patient's age is 54, we will have higher threshold for hypertension - Hydralazine 5 mg IV every 6 hours as needed for SBP greater than 170, 4 doses ordered   # History of dementia - recently moved to [place] from [place] home memantine 10 mg p.o. twice daily, donepezil 10 mg nightly - Fall precautions   # Depression/anxiety-resumed home sertraline 25 mg p.o. nightly # Insomnia-resumed home melatonin 10 mg nightly   DVT prophylaxis: SQ heparin Code Status: DNR Family Communication: Daughter Sela Hilding [phone] on 9/22 Disposition Plan: Status is: Inpatient  Remains inpatient appropriate because:Inpatient level of care appropriate due to severity of illness  Dispo: The patient is from: ALF  Anticipated d/c is to: ALF              Patient currently is not medically stable to d/c.   Difficult to place patient No       Level of care: Med-Surg  Consultants:  None  Procedures:  None  Antimicrobials:  None   Subjective: Seen and examined.  Sleeping but easily aroused.  History limited by underlying dementia.  Objective: Vitals:   01/31/21 1030 01/31/21 1100 01/31/21 1130 01/31/21 1135  BP: (!) 151/67 (!)  167/83 (!) 162/73   Pulse: (!) 54 70 (!) 54 63  Resp: '17 12 12 20  '$ Temp:      TempSrc:      SpO2: 100% 100% 100% 98%  Weight:        Intake/Output Summary (Last 24 hours) at 01/31/2021 1329 Last data filed at 01/31/2021 0849 Gross per 24 hour  Intake 2096.36 ml  Output 450 ml  Net 1646.36 ml   Filed Weights   01/30/21 1315  Weight: 68 kg    Examination:  General exam: No acute distress.  Appears frail Respiratory system: Poor respiratory effort.  Lungs clear.  Room air Cardiovascular system: S1-S2, RRR, no murmurs, no pedal edema Gastrointestinal system: Abdomen is nondistended, soft and nontender. No organomegaly or masses felt. Normal bowel sounds heard. Central nervous system: Sleepy.  Oriented x1.  No focal deficits Extremities: Symmetric 5 x 5 power. Skin: No rashes, lesions or ulcers Psychiatry: Judgement and insight appear impaired. Mood & affect flattened.     Data Reviewed: I have personally reviewed following labs and imaging studies  CBC: Recent Labs  Lab 01/30/21 1314  WBC 12.9*  HGB 11.8*  HCT 35.9*  MCV 90.7  PLT 123456   Basic Metabolic Panel: Recent Labs  Lab 01/30/21 1332 01/30/21 1545 01/30/21 1801 01/30/21 2302 01/31/21 0607  NA 130* 131* 135 134* 133*  K 4.3 4.4 3.5 4.3 4.4  CL 95* 98 106 102 100  CO2 13* 17* 20* 23 21*  GLUCOSE 722* 497* 310* 177* 164*  BUN 47* 43* 39* 43* 43*  CREATININE 2.89* 2.44* 1.96* 2.27* 2.16*  CALCIUM 8.2* 7.7* 6.6* 7.8* 8.2*  MG  --   --   --  1.8  --    GFR: CrCl cannot be calculated (Unknown ideal weight.). Liver Function Tests: Recent Labs  Lab 01/30/21 1314 01/30/21 2302  AST 27  --   ALT 15  --   ALKPHOS 80  --   BILITOT 1.6*  --   PROT 6.7  --   ALBUMIN 3.7 3.0*   Recent Labs  Lab 01/30/21 1332  LIPASE 45   No results for input(s): AMMONIA in the last 168 hours. Coagulation Profile: No results for input(s): INR, PROTIME in the last 168 hours. Cardiac Enzymes: No results for  input(s): CKTOTAL, CKMB, CKMBINDEX, TROPONINI in the last 168 hours. BNP (last 3 results) No results for input(s): PROBNP in the last 8760 hours. HbA1C: Recent Labs    01/30/21 1314  HGBA1C 9.1*   CBG: Recent Labs  Lab 01/31/21 0003 01/31/21 0205 01/31/21 0600 01/31/21 0835 01/31/21 1137  GLUCAP 163* 128* 149* 129* 153*   Lipid Profile: No results for input(s): CHOL, HDL, LDLCALC, TRIG, CHOLHDL, LDLDIRECT in the last 72 hours. Thyroid Function Tests: Recent Labs    01/30/21 1314  TSH 3.697   Anemia Panel: No results for input(s): VITAMINB12, FOLATE, FERRITIN, TIBC, IRON, RETICCTPCT in the last 72 hours. Sepsis Labs: Recent Labs  Lab 01/30/21 1434 01/30/21 2302  LATICACIDVEN 4.3* 1.3    Recent Results (from the past 240 hour(s))  Resp Panel by RT-PCR (Flu A&B, Covid) Nasopharyngeal Swab     Status: None   Collection Time: 01/30/21  1:32 PM   Specimen: Nasopharyngeal Swab; Nasopharyngeal(NP) swabs in vial transport medium  Result Value Ref Range Status   SARS Coronavirus 2 by RT PCR NEGATIVE NEGATIVE Final    Comment: (NOTE) SARS-CoV-2 target nucleic acids are NOT DETECTED.  The SARS-CoV-2 RNA is generally detectable in upper respiratory specimens during the acute phase of infection. The lowest concentration of SARS-CoV-2 viral copies this assay can detect is 138 copies/mL. A negative result does not preclude SARS-Cov-2 infection and should not be used as the sole basis for treatment or other patient management decisions. A negative result may occur with  improper specimen collection/handling, submission of specimen other than nasopharyngeal swab, presence of viral mutation(s) within the areas targeted by this assay, and inadequate number of viral copies(<138 copies/mL). A negative result must be combined with clinical observations, patient history, and epidemiological information. The expected result is Negative.  Fact Sheet for Patients:   EntrepreneurPulse.com.au  Fact Sheet for Healthcare Providers:  IncredibleEmployment.be  This test is no t yet approved or cleared by the [place] FDA and  has been authorized for detection and/or diagnosis of SARS-CoV-2 by FDA under an Emergency Use Authorization (EUA). This EUA will remain  in effect (meaning this test can be used) for the duration of the COVID-19 declaration under Section 564(b)(1) of the Act, 21 U.S.C.section 360bbb-3(b)(1), unless the authorization is terminated  or revoked sooner.       Influenza A by PCR NEGATIVE NEGATIVE Final   Influenza B by PCR NEGATIVE NEGATIVE Final    Comment: (NOTE) The Xpert Xpress SARS-CoV-2/FLU/RSV plus assay is intended as an aid in the diagnosis of influenza from Nasopharyngeal swab specimens and should not be used as a sole basis for treatment. Nasal washings and aspirates are unacceptable for Xpert Xpress SARS-CoV-2/FLU/RSV testing.  Fact Sheet for Patients: EntrepreneurPulse.com.au  Fact Sheet for Healthcare Providers: IncredibleEmployment.be  This test is not yet approved or cleared by the [place] FDA and has been authorized for detection and/or diagnosis of SARS-CoV-2 by FDA under an Emergency Use Authorization (EUA). This EUA will remain in effect (meaning this test can be used) for the duration of the COVID-19 declaration under Section 564(b)(1) of the Act, 21 U.S.C. section 360bbb-3(b)(1), unless the authorization is terminated or revoked.  Performed at [facility], [address], [place], [place] [address]          Radiology Studies: Portable chest x-ray (1 view)  Result Date: 01/30/2021 CLINICAL DATA:  Hyperglycemia and DKA EXAM: PORTABLE CHEST 1 VIEW COMPARISON:  None. FINDINGS: Cardiac shadow is within normal limits. Aortic calcifications are noted. Lungs are well aerated bilaterally. Multiple calcified  granulomas are seen bilaterally. No focal infiltrate or effusion is noted. Changes of prior vertebral augmentation are seen. IMPRESSION: No acute abnormality noted. Changes of prior granulomatous disease are seen. Electronically Signed   By: Inez Catalina M.D.   On: 01/30/2021 15:30        Scheduled Meds:  donepezil  10 mg Oral QHS   heparin  5,000 Units Subcutaneous Q8H   insulin aspart  0-15 Units Subcutaneous TID AC & HS   insulin glargine-yfgn  10 Units Subcutaneous QHS   levothyroxine  125 mcg Oral QAC breakfast   melatonin  10 mg  Oral QHS   memantine  10 mg Oral BID   senna-docusate  1 tablet Oral Q12H   sertraline  25 mg Oral QHS   Continuous Infusions:  sodium chloride 75 mL/hr at 01/31/21 0829     LOS: 1 day    Time spent: 25 minutes    Sidney Ace, MD [facility] Pager [phone]  If 7PM-7AM, please contact night-coverage 01/31/2021, 1:29 PM

## 2021-01-31 NOTE — ED Notes (Signed)
Meal tray at bedside.  

## 2021-01-31 NOTE — Progress Notes (Signed)
RN left voicemail with pts daughter Sela Hilding in an attempt to complete admission questions.

## 2021-02-01 LAB — GLUCOSE, CAPILLARY: Glucose-Capillary: 110 mg/dL — ABNORMAL HIGH (ref 70–99)

## 2021-02-01 NOTE — Evaluation (Signed)
Occupational Therapy Evaluation Patient Details Name: Katelyn Arroyo MRN: [id number] DOB: March 18, 1929 Today's Date: 02/01/2021   History of Present Illness Pt is an 87 y/o F with PMH: IDDM, hypothyroidism, dementia, and depression. Pt presents from facility [facility]) d/t concerns for high blood glucose. Pt adm for DKA.   Clinical Impression   Pt seen for OT evaluation this date in setting of acute hospitalization d/t DKA. Pt's BG normalized today per RN. Pt is somewhat confused/poor historian, but is oriented to self, place and some aspects of time. Pt requires cues for sequencing and safety as well as SUPV for safety with standing ADLs. Demos some decreased balance with fxl mobility and static standing tasks, appears to be seeking UE support, perhaps furniture walks at baseline, but pt is unclear in providing information in this respect. No gross LOB and does not require physical assistance, but some cues for safety awareness. Pt left in chair with all needs met and in reach, RN notified of session contents. Will continue to follow acutely and anticipate that pt would benefit from [facility] g./u in her ALF setting.      Recommendations for follow up therapy are one component of a multi-disciplinary discharge planning process, led by the attending physician.  Recommendations may be updated based on patient status, additional functional criteria and insurance authorization.   Follow Up Recommendations  Home health OT;Supervision - Intermittent    Equipment Recommendations  3 in 1 bedside commode;Other (comment) (single point cane)    Recommendations for Other Services       Precautions / Restrictions Precautions Precautions: Fall Restrictions Weight Bearing Restrictions: No      Mobility Bed Mobility Overal bed mobility: Modified Independent             General bed mobility comments: HOB elevated, increased time    Transfers Overall transfer level: Needs assistance Equipment used:  None Transfers: Sit to/from Stand Sit to Stand: Supervision              Balance Overall balance assessment: Needs assistance   Sitting balance-Leahy Scale: Normal       Standing balance-Leahy Scale: Fair Standing balance comment: definitely appears to be seeking UE support (unclear if she furniture walks at home, she says "maybe"). No gross LOB, but some sway.                           ADL either performed or assessed with clinical judgement   ADL                                         General ADL Comments: Pt requires SUPV for all standing ADLs d/t some decreased standing balance/tolerance at this time, but is otherwise able to physically perform the tasks and only requires minimal cueing for sequencing d/t unfamiliar ADL items.     Vision Patient Visual Report: No change from baseline       Perception     Praxis      Pertinent Vitals/Pain Pain Assessment: No/denies pain     Hand Dominance     Extremity/Trunk Assessment Upper Extremity Assessment Upper Extremity Assessment: Overall WFL for tasks assessed;Generalized weakness (ROM WFL, MMT grossly 4-/5)   Lower Extremity Assessment Lower Extremity Assessment: Overall WFL for tasks assessed;Generalized weakness       Communication Communication Communication: No difficulties   Cognition Arousal/Alertness:  Awake/alert Behavior During Therapy: WFL for tasks assessed/performed Overall Cognitive Status: History of cognitive impairments - at baseline                                 General Comments: Pt is oriented to self, place, some aspects of situation and some aspects of time, but not day/date.   General Comments       Exercises Other Exercises Other Exercises: OT facilitates ed re: role of OT, safety considerations. Pt with moderate reception   Shoulder Instructions      Home Living Family/patient expects to be discharged to:: Assisted living                              Home Equipment: Shower seat   Additional Comments: walk in shower      Prior Functioning/Environment Level of Independence: Needs assistance  Gait / Transfers Assistance Needed: Pt reports no use of cane or walker at baseline. ADL's / Homemaking Assistance Needed: facility assist for IADLs. Pt reports being able to perform ADLs I'ly. She is somewhat of a poor historian although mentating better this date than on admission (according to chart review)            OT Problem List: Decreased activity tolerance;Impaired balance (sitting and/or standing);Decreased safety awareness      OT Treatment/Interventions: Self-care/ADL training;Therapeutic exercise;Therapeutic activities;DME and/or AE instruction    OT Goals(Current goals can be found in the care plan section) Acute Rehab OT Goals Patient Stated Goal: to go home OT Goal Formulation: With patient Time For Goal Achievement: 02/15/21 Potential to Achieve Goals: Good ADL Goals Pt Will Perform Lower Body Bathing: with modified independence Pt Will Perform Lower Body Dressing: with modified independence Pt Will Transfer to Toilet: with modified independence  OT Frequency: Min 1X/week   Barriers to D/C:            Co-evaluation              AM-PAC OT "6 Clicks" Daily Activity     Outcome Measure Help from another person eating meals?: None Help from another person taking care of personal grooming?: None Help from another person toileting, which includes using toliet, bedpan, or urinal?: A Little Help from another person bathing (including washing, rinsing, drying)?: A Little Help from another person to put on and taking off regular upper body clothing?: None Help from another person to put on and taking off regular lower body clothing?: A Little 6 Click Score: 21   End of Session Equipment Utilized During Treatment: Gait belt;Rolling walker Nurse Communication: Mobility  status  Activity Tolerance: Patient tolerated treatment well Patient left: in chair;with call bell/phone within reach  OT Visit Diagnosis: Unsteadiness on feet (R26.81);Muscle weakness (generalized) (M62.81)                Time: 9373-4287 OT Time Calculation (min): 23 min Charges:  OT General Charges $OT Visit: 1 Visit OT Evaluation $OT Eval Low Complexity: 1 Low OT Treatments $Self Care/Home Management : 8-22 mins  Gerrianne Scale, MS, OTR/L ascom [phone] 02/01/21, 9:54 AM

## 2021-02-01 NOTE — Evaluation (Signed)
Physical Therapy Evaluation Patient Details Name: Kinsler Starns MRN: [id number] DOB: 1928/06/25 Today's Date: 02/01/2021  History of Present Illness  Pt is an 87 y/o F with PMH: IDDM, hypothyroidism, dementia, and depression. Pt presents from facility [facility]) d/t concerns for high blood glucose. Pt adm for DKA.  Clinical Impression  Patient seated in recliner beginning of treatment session; alert and oriented to self, location and general situation.  Follows commands, pleasant and cooperative throughout session.  Bilat UE/LE strength and ROM grossly symmetrical and WFL; no focal weakness appreciated.  Able to complete sit/stand, basic transfers and gait (200') with HHA, min assist.  Increased sway noted all planes, limited balance reactions evident (frequently grabbing for walls, furniture throughout distance). Additional gait trial (100', close sup) completed with use of RW; noted improvement in gait symmetry, fluidity and overall safety.  Do recommend continued use of RW with all mobility at this time. Would benefit from skilled PT to address above deficits and promote optimal return to PLOF.; Recommend transition to HHPT upon discharge from acute hospitalization.        Recommendations for follow up therapy are one component of a multi-disciplinary discharge planning process, led by the attending physician.  Recommendations may be updated based on patient status, additional functional criteria and insurance authorization.  Follow Up Recommendations Home health PT    Equipment Recommendations  Rolling walker with 5" wheels    Recommendations for Other Services       Precautions / Restrictions Precautions Precautions: Fall Restrictions Weight Bearing Restrictions: No      Mobility  Bed Mobility               General bed mobility comments: seated in recliner beginning/end of treatment session    Transfers Overall transfer level: Needs assistance   Transfers: Sit to/from  Stand Sit to Stand: Min guard;Supervision         General transfer comment: does require UE support to stabilize  Ambulation/Gait Ambulation/Gait assistance: Min guard Gait Distance (Feet): 200 Feet Assistive device: 1 person hand held assist   Gait velocity: 10' walk time, 10 seconds   General Gait Details: reciprocal stepping pattern, increased sway in all directions; limited balance reactions evident.  Frequently reaching for walls, furniture for stabilization throughout distance  Stairs            Wheelchair Mobility    Modified Rankin (Stroke Patients Only)       Balance Overall balance assessment: Needs assistance Sitting-balance support: No upper extremity supported;Feet supported Sitting balance-Leahy Scale: Good     Standing balance support: No upper extremity supported Standing balance-Leahy Scale: Fair                               Pertinent Vitals/Pain Pain Assessment: No/denies pain    Home Living Family/patient expects to be discharged to:: Assisted living               Home Equipment: Shower seat Additional Comments: walk in shower    Prior Function Level of Independence: Needs assistance   Gait / Transfers Assistance Needed: Pt reports no use of cane or walker at baseline.  ADL's / Homemaking Assistance Needed: facility assist for IADLs. Pt reports being able to perform ADLs I'ly. She is somewhat of a poor historian although mentating better this date than on admission (according to chart review)        Hand Dominance  Extremity/Trunk Assessment   Upper Extremity Assessment Upper Extremity Assessment: Overall WFL for tasks assessed    Lower Extremity Assessment Lower Extremity Assessment: Overall WFL for tasks assessed       Communication   Communication: No difficulties  Cognition Arousal/Alertness: Awake/alert Behavior During Therapy: WFL for tasks assessed/performed Overall Cognitive Status:  History of cognitive impairments - at baseline                                 General Comments: Pt is oriented to self, place, some aspects of situation and some aspects of time, but not day/date.      General Comments      Exercises Other Exercises Other Exercises: 100' with RW, close sup-noted improvement in stepping pattern, gait symmetry, fluidity and overall safety; patietn subjectively voicing improved comfort/confidence with RW.  Do recommend continued use with all mobility upon discharge Other Exercises: Toilet transfer, ambulatory with RW, cga/close sup-min cuing for walker position, use and safety (often attempts to step away from); sit/stand from toilet, close sup with grab bar; standing balance at sink for hand hygiene, close sup.   Assessment/Plan    PT Assessment Patient needs continued PT services  PT Problem List Decreased activity tolerance;Decreased balance;Decreased mobility;Decreased safety awareness       PT Treatment Interventions DME instruction;Gait training;Functional mobility training;Therapeutic exercise;Balance training;Therapeutic activities;Patient/family education;Cognitive remediation    PT Goals (Current goals can be found in the Care Plan section)  Acute Rehab PT Goals Patient Stated Goal: to go home PT Goal Formulation: With patient Time For Goal Achievement: 02/15/21 Potential to Achieve Goals: Good    Frequency Min 2X/week   Barriers to discharge        Co-evaluation               AM-PAC PT "6 Clicks" Mobility  Outcome Measure Help needed turning from your back to your side while in a flat bed without using bedrails?: None Help needed moving from lying on your back to sitting on the side of a flat bed without using bedrails?: None Help needed moving to and from a bed to a chair (including a wheelchair)?: A Little Help needed standing up from a chair using your arms (e.g., wheelchair or bedside chair)?: A Little Help  needed to walk in hospital room?: A Little Help needed climbing 3-5 steps with a railing? : A Little 6 Click Score: 20    End of Session   Activity Tolerance: Patient tolerated treatment well Patient left: in chair;with call bell/phone within reach;with chair alarm set Nurse Communication: Mobility status PT Visit Diagnosis: Difficulty in walking, not elsewhere classified (R26.2)    Time: [id number] PT Time Calculation (min) (ACUTE ONLY): 15 min   Charges:   PT Evaluation $PT Eval Moderate Complexity: 1 Mod PT Treatments $Gait Training: 8-22 mins        Marnesha Gagen H. Owens Shark, PT, DPT, NCS 02/01/21, 2:32 PM [phone]

## 2021-02-01 NOTE — Care Management Important Message (Signed)
Important Message  Patient Details  Name: Cathryne Herbst MRN: [id number] Date of Birth: 1928/10/10   Medicare Important Message Given:  No     Dannette Barbara 02/01/2021, 8:28 AM

## 2021-02-01 NOTE — Plan of Care (Signed)
?  Problem: Education: ?Goal: Knowledge of General Education information will improve ?Description: Including pain rating scale, medication(s)/side effects and non-pharmacologic comfort measures ?Outcome: Not Progressing ?  ?Problem: Health Behavior/Discharge Planning: ?Goal: Ability to manage health-related needs will improve ?Outcome: Progressing ?  ?Problem: Clinical Measurements: ?Goal: Ability to maintain clinical measurements within normal limits will improve ?Outcome: Progressing ?Goal: Will remain free from infection ?Outcome: Progressing ?Goal: Diagnostic test results will improve ?Outcome: Progressing ?Goal: Respiratory complications will improve ?Outcome: Progressing ?Goal: Cardiovascular complication will be avoided ?Outcome: Progressing ?  ?Problem: Activity: ?Goal: Risk for activity intolerance will decrease ?Outcome: Progressing ?  ?Problem: Nutrition: ?Goal: Adequate nutrition will be maintained ?Outcome: Progressing ?  ?Problem: Coping: ?Goal: Level of anxiety will decrease ?Outcome: Progressing ?  ?Problem: Elimination: ?Goal: Will not experience complications related to bowel motility ?Outcome: Progressing ?Goal: Will not experience complications related to urinary retention ?Outcome: Progressing ?  ?Problem: Pain Managment: ?Goal: General experience of comfort will improve ?Outcome: Progressing ?  ?Problem: Safety: ?Goal: Ability to remain free from injury will improve ?Outcome: Progressing ?  ?Problem: Skin Integrity: ?Goal: Risk for impaired skin integrity will decrease ?Outcome: Progressing ?  ?

## 2021-02-01 NOTE — Discharge Summary (Signed)
Physician Discharge Summary  Katelyn Arroyo [id number] DOB: [birth year] DOA: 01/30/2021  PCP: System, Provider Not In  Admit date: 01/30/2021 Discharge date: 02/01/2021  Admitted From: ALF Disposition: ALF, [facility]  Recommendations for Outpatient Follow-up:  Follow up with PCP in 1-2 weeks   Home Health: No Equipment/Devices: None  Discharge Condition: Stable CODE STATUS: DNR Diet recommendation: Carb modified  Brief/Interim Summary: 87 y.o. female with medical history significant for insulin-dependent diabetes mellitus, hypothyroid, depression/anxiety, dementia, hypothyroid, presents to the emergency department for chief concerns of high blood sugar from facility.   At bedside was able to tell me her name, the current location is hospital.  She was not able to tell me her age, current calendar year nor the current president.  She is very pleasant and does not appear to be in acute distress.   She kept asking me why she in the hospital despite multiple answers.  She does note that her only child is Katelyn Arroyo, her daughter.   She states that she feels fine and denies chest pain, shortness of breath, abdominal pain.  She states that she has not had any thing to eat.  However she states she does not feel hungry.  And then she states that she does not know if she is had anything to eat.   Per daughter, patient has been at [facility] for less than 1 week.  Patient has advanced dementia and her medications are administered by facility staff.    She went out to eat lunch with grandson on 01/29/21 at a [place], patient had a taco salad. Per daughter, patient was in normal state of health and had no complaints.    Patient presented with glucose greater than 765 and laboratory data consistent with DKA with elevated anion gap.  Started on insulin gtt. via Endo tool protocol.  Anion gap closed and patient was transitioned to subcutaneous regimen with good result  Sugars remain controlled  after insulin drip discontinued.  Patient placed back on home insulin regimen.  Etiology of DKA is unclear.  Possible medication versus dietary indiscretion.  Will not make any changes at home medication regimen.  Patient stable for discharge back to [facility] assisted living.  Discharge Diagnoses:  Principal Problem:   DKA (diabetic ketoacidosis) ([place]) Active Problems:   Essential hypertension   Dementia (HCC)   COPD (chronic obstructive pulmonary disease) (HCC)   Hyperlipidemia   Elevated troponin  # DKA with gap of 23 on admission Etiology unclear No evidence of infection DKA resolved with Endo tool protocol Transition to subcutaneous regimen Plan: Discharge back to assisted living facility.  No changes made in home insulin regimen.  Suggest follow-up with endocrinology or PCP to determine etiology of recurrent DKA as patient's daughter states that she has been hospitalized multiple times for same complaint.     # Hyponatremia secondary to hyperglycemia Resolved at time of discharge   # Elevated troponin low clinical suspicion for ACS, I suspect this is secondary to DKA - Patient not have chest pain and there was no ST elevation on EKG - No prior EKG for further comparison   # Acute kidney injury versus CKD 4 - No prior labs available in EHR and no access to care everywhere - Serum creatinine on presentation is 2.90, GFR 15 Improved at time of discharge  # Mild hypertension-no prior history - Given the patient's age is 81, we will have higher threshold for hypertension - Hydralazine 5 mg IV every 6 hours as needed for SBP  greater than 170, 4 doses ordered.  Consider outpatient follow-up and initiation of low-dose antihypertensives.  Will not start at time of discharge   # History of dementia - recently moved to [place] from [place] home memantine 10 mg p.o. twice daily, donepezil 10 mg nightly - Fall precautions   # Depression/anxiety-resumed home  sertraline 25 mg p.o. nightly # Insomnia-resumed home melatonin 10 mg nightly  Discharge Instructions  Discharge Instructions     Diet - low sodium heart healthy   Complete by: As directed    Increase activity slowly   Complete by: As directed    Increase activity slowly   Complete by: As directed       Allergies as of 02/01/2021       Reactions   Ceftriaxone         Medication List     TAKE these medications    acetaminophen 325 MG tablet Commonly known as: TYLENOL Take 650 mg by mouth every 6 (six) hours as needed for mild pain.   donepezil 10 MG tablet Commonly known as: ARICEPT Take 10 mg by mouth at bedtime.   insulin aspart 100 UNIT/ML injection Commonly known as: novoLOG Inject 4-10 Units into the skin 3 (three) times daily with meals. Inject 10 units subcutaneously in the morning, 7 units with lunch and 4 units in the evening (give with a snack/meal)   insulin glargine 100 UNIT/ML injection Commonly known as: LANTUS Inject 10 Units into the skin daily.   levothyroxine 125 MCG tablet Commonly known as: SYNTHROID Take 125 mcg by mouth daily before breakfast.   Melatonin 10 MG Tabs Take 10 mg by mouth at bedtime.   memantine 10 MG tablet Commonly known as: NAMENDA Take 10 mg by mouth 2 (two) times daily.   senna-docusate 8.6-50 MG tablet Commonly known as: Senokot-S Take 1 tablet by mouth every 12 (twelve) hours.   sertraline 25 MG tablet Commonly known as: ZOLOFT Take 25 mg by mouth at bedtime.        Allergies  Allergen Reactions   Ceftriaxone     Consultations: None   Procedures/Studies: Portable chest x-ray (1 view)  Result Date: 01/30/2021 CLINICAL DATA:  Hyperglycemia and DKA EXAM: PORTABLE CHEST 1 VIEW COMPARISON:  None. FINDINGS: Cardiac shadow is within normal limits. Aortic calcifications are noted. Lungs are well aerated bilaterally. Multiple calcified granulomas are seen bilaterally. No focal infiltrate or effusion is  noted. Changes of prior vertebral augmentation are seen. IMPRESSION: No acute abnormality noted. Changes of prior granulomatous disease are seen. Electronically Signed   By: Inez Catalina M.D.   On: 01/30/2021 15:30   (Echo, Carotid, EGD, Colonoscopy, ERCP)    Subjective: And examined on day of discharge.  Sitting up in bed eating breakfast.  No apparent distress.  Answers all questions appropriately.  Stable for discharge back to [facility] assisted living.  Discharge Exam: Vitals:   02/01/21 0356 02/01/21 0759  BP: (!) 162/70 (!) 167/65  Pulse: 71 66  Resp: 20   Temp: 97.6 F (36.4 C) 98.1 F (36.7 C)  SpO2: 97% 96%   Vitals:   01/31/21 1937 01/31/21 1938 02/01/21 0356 02/01/21 0759  BP: (!) 181/64 (!) 157/68 (!) 162/70 (!) 167/65  Pulse: 64 63 71 66  Resp: 16  20   Temp: 97.6 F (36.4 C)  97.6 F (36.4 C) 98.1 F (36.7 C)  TempSrc: Oral  Oral Oral  SpO2: 100%  97% 96%  Weight:  General: Pt is alert, awake, not in acute distress Cardiovascular: RRR, S1/S2 +, no rubs, no gallops Respiratory: CTA bilaterally, no wheezing, no rhonchi Abdominal: Soft, NT, ND, bowel sounds + Extremities: no edema, no cyanosis    The results of significant diagnostics from this hospitalization (including imaging, microbiology, ancillary and laboratory) are listed below for reference.     Microbiology: Recent Results (from the past 240 hour(s))  Resp Panel by RT-PCR (Flu A&B, Covid) Nasopharyngeal Swab     Status: None   Collection Time: 01/30/21  1:32 PM   Specimen: Nasopharyngeal Swab; Nasopharyngeal(NP) swabs in vial transport medium  Result Value Ref Range Status   SARS Coronavirus 2 by RT PCR NEGATIVE NEGATIVE Final    Comment: (NOTE) SARS-CoV-2 target nucleic acids are NOT DETECTED.  The SARS-CoV-2 RNA is generally detectable in upper respiratory specimens during the acute phase of infection. The lowest concentration of SARS-CoV-2 viral copies this assay can detect is 138  copies/mL. A negative result does not preclude SARS-Cov-2 infection and should not be used as the sole basis for treatment or other patient management decisions. A negative result may occur with  improper specimen collection/handling, submission of specimen other than nasopharyngeal swab, presence of viral mutation(s) within the areas targeted by this assay, and inadequate number of viral copies(<138 copies/mL). A negative result must be combined with clinical observations, patient history, and epidemiological information. The expected result is Negative.  Fact Sheet for Patients:  EntrepreneurPulse.com.au  Fact Sheet for Healthcare Providers:  IncredibleEmployment.be  This test is no t yet approved or cleared by the [place] FDA and  has been authorized for detection and/or diagnosis of SARS-CoV-2 by FDA under an Emergency Use Authorization (EUA). This EUA will remain  in effect (meaning this test can be used) for the duration of the COVID-19 declaration under Section 564(b)(1) of the Act, 21 U.S.C.section 360bbb-3(b)(1), unless the authorization is terminated  or revoked sooner.       Influenza A by PCR NEGATIVE NEGATIVE Final   Influenza B by PCR NEGATIVE NEGATIVE Final    Comment: (NOTE) The Xpert Xpress SARS-CoV-2/FLU/RSV plus assay is intended as an aid in the diagnosis of influenza from Nasopharyngeal swab specimens and should not be used as a sole basis for treatment. Nasal washings and aspirates are unacceptable for Xpert Xpress SARS-CoV-2/FLU/RSV testing.  Fact Sheet for Patients: EntrepreneurPulse.com.au  Fact Sheet for Healthcare Providers: IncredibleEmployment.be  This test is not yet approved or cleared by the [place] FDA and has been authorized for detection and/or diagnosis of SARS-CoV-2 by FDA under an Emergency Use Authorization (EUA). This EUA will remain in effect (meaning  this test can be used) for the duration of the COVID-19 declaration under Section 564(b)(1) of the Act, 21 U.S.C. section 360bbb-3(b)(1), unless the authorization is terminated or revoked.  Performed at [facility], [address] [place], [place] [address]      Labs: BNP (last 3 results) No results for input(s): BNP in the last 8760 hours. Basic Metabolic Panel: Recent Labs  Lab 01/30/21 1332 01/30/21 1545 01/30/21 1801 01/30/21 2302 01/31/21 0607  NA 130* 131* 135 134* 133*  K 4.3 4.4 3.5 4.3 4.4  CL 95* 98 106 102 100  CO2 13* 17* 20* 23 21*  GLUCOSE 722* 497* 310* 177* 164*  BUN 47* 43* 39* 43* 43*  CREATININE 2.89* 2.44* 1.96* 2.27* 2.16*  CALCIUM 8.2* 7.7* 6.6* 7.8* 8.2*  MG  --   --   --  1.8  --  Liver Function Tests: Recent Labs  Lab 01/30/21 1314 01/30/21 2302  AST 27  --   ALT 15  --   ALKPHOS 80  --   BILITOT 1.6*  --   PROT 6.7  --   ALBUMIN 3.7 3.0*   Recent Labs  Lab 01/30/21 1332  LIPASE 45   No results for input(s): AMMONIA in the last 168 hours. CBC: Recent Labs  Lab 01/30/21 1314  WBC 12.9*  HGB 11.8*  HCT 35.9*  MCV 90.7  PLT 255   Cardiac Enzymes: No results for input(s): CKTOTAL, CKMB, CKMBINDEX, TROPONINI in the last 168 hours. BNP: Invalid input(s): POCBNP CBG: Recent Labs  Lab 01/31/21 0600 01/31/21 0835 01/31/21 1137 01/31/21 1836 01/31/21 2100  GLUCAP 149* 129* 153* 173* 148*   D-Dimer No results for input(s): DDIMER in the last 72 hours. Hgb A1c Recent Labs    01/30/21 1314  HGBA1C 9.1*   Lipid Profile No results for input(s): CHOL, HDL, LDLCALC, TRIG, CHOLHDL, LDLDIRECT in the last 72 hours. Thyroid function studies Recent Labs    01/30/21 1314  TSH 3.697   Anemia work up No results for input(s): VITAMINB12, FOLATE, FERRITIN, TIBC, IRON, RETICCTPCT in the last 72 hours. Urinalysis    Component Value Date/Time   COLORURINE STRAW (A) 01/30/2021 1332   APPEARANCEUR CLEAR (A)  01/30/2021 1332   LABSPEC 1.017 01/30/2021 1332   PHURINE 5.0 01/30/2021 1332   GLUCOSEU >=500 (A) 01/30/2021 1332   HGBUR SMALL (A) 01/30/2021 1332   BILIRUBINUR NEGATIVE 01/30/2021 1332   KETONESUR 20 (A) 01/30/2021 1332   PROTEINUR 100 (A) 01/30/2021 1332   NITRITE NEGATIVE 01/30/2021 1332   LEUKOCYTESUR NEGATIVE 01/30/2021 1332   Sepsis Labs Invalid input(s): PROCALCITONIN,  WBC,  LACTICIDVEN Microbiology Recent Results (from the past 240 hour(s))  Resp Panel by RT-PCR (Flu A&B, Covid) Nasopharyngeal Swab     Status: None   Collection Time: 01/30/21  1:32 PM   Specimen: Nasopharyngeal Swab; Nasopharyngeal(NP) swabs in vial transport medium  Result Value Ref Range Status   SARS Coronavirus 2 by RT PCR NEGATIVE NEGATIVE Final    Comment: (NOTE) SARS-CoV-2 target nucleic acids are NOT DETECTED.  The SARS-CoV-2 RNA is generally detectable in upper respiratory specimens during the acute phase of infection. The lowest concentration of SARS-CoV-2 viral copies this assay can detect is 138 copies/mL. A negative result does not preclude SARS-Cov-2 infection and should not be used as the sole basis for treatment or other patient management decisions. A negative result may occur with  improper specimen collection/handling, submission of specimen other than nasopharyngeal swab, presence of viral mutation(s) within the areas targeted by this assay, and inadequate number of viral copies(<138 copies/mL). A negative result must be combined with clinical observations, patient history, and epidemiological information. The expected result is Negative.  Fact Sheet for Patients:  EntrepreneurPulse.com.au  Fact Sheet for Healthcare Providers:  IncredibleEmployment.be  This test is no t yet approved or cleared by the [place] FDA and  has been authorized for detection and/or diagnosis of SARS-CoV-2 by FDA under an Emergency Use Authorization (EUA). This  EUA will remain  in effect (meaning this test can be used) for the duration of the COVID-19 declaration under Section 564(b)(1) of the Act, 21 U.S.C.section 360bbb-3(b)(1), unless the authorization is terminated  or revoked sooner.       Influenza A by PCR NEGATIVE NEGATIVE Final   Influenza B by PCR NEGATIVE NEGATIVE Final    Comment: (NOTE) The Xpert Xpress  SARS-CoV-2/FLU/RSV plus assay is intended as an aid in the diagnosis of influenza from Nasopharyngeal swab specimens and should not be used as a sole basis for treatment. Nasal washings and aspirates are unacceptable for Xpert Xpress SARS-CoV-2/FLU/RSV testing.  Fact Sheet for Patients: EntrepreneurPulse.com.au  Fact Sheet for Healthcare Providers: IncredibleEmployment.be  This test is not yet approved or cleared by the [place] FDA and has been authorized for detection and/or diagnosis of SARS-CoV-2 by FDA under an Emergency Use Authorization (EUA). This EUA will remain in effect (meaning this test can be used) for the duration of the COVID-19 declaration under Section 564(b)(1) of the Act, 21 U.S.C. section 360bbb-3(b)(1), unless the authorization is terminated or revoked.  Performed at [facility], [address], [place], [place] [address]      Time coordinating discharge: Over 30 minutes  SIGNED:   Sidney Ace, MD  [facility] 02/01/2021, 9:17 AM Pager   If 7PM-7AM, please contact night-coverage

## 2021-02-01 NOTE — TOC Initial Note (Signed)
Transition of Care [facility]) - Initial/Assessment Note    Patient Details  Name: Katelyn Arroyo MRN: [id number] Date of Birth: Oct 08, 1928  [facility] CM/SW Contact:    Beverly Sessions, RN Phone Number: 02/01/2021, 1:11 PM  Clinical Narrative:                 Patient to return to [place] today Information faxed to [place] at [facility]. She has arranged transport for 2 pm  VM left for daughter to update Patient agreeable for home health services.  Per Lattie Haw their preferred agency is [facility].  Referral made to Sarah at [facility] to deliver [place] to room prior to discharge Per Lattie Haw report does not need to be called   Expected Discharge Plan: Assisted Living Barriers to Discharge: Barriers Resolved   Patient Goals and CMS Choice        Expected Discharge Plan and Services Expected Discharge Plan: Assisted Living         Expected Discharge Date: 02/01/21                                    Prior Living Arrangements/Services   Lives with:: Facility Resident Patient language and need for interpreter reviewed:: Yes Do you feel safe going back to the place where you live?: Yes      Need for Family Participation in Patient Care: Yes (Comment) Care giver support system in place?: Yes (comment)   Criminal Activity/Legal Involvement Pertinent to Current Situation/Hospitalization: No - Comment as needed  Activities of Daily Living Home Assistive Devices/Equipment: Eyeglasses, CBG Meter ADL Screening (condition at time of admission) Patient's cognitive ability adequate to safely complete daily activities?: Yes Is the patient deaf or have difficulty hearing?: No Does the patient have difficulty seeing, even when wearing glasses/contacts?: No Does the patient have difficulty concentrating, remembering, or making decisions?: Yes Patient able to express need for assistance with ADLs?: Yes Does the patient have difficulty dressing or bathing?:  No Independently performs ADLs?: Yes (appropriate for developmental age) Does the patient have difficulty walking or climbing stairs?: No Weakness of Legs: None Weakness of Arms/Hands: None  Permission Sought/Granted                  Emotional Assessment       Orientation: : Oriented to Self, Oriented to Place, Oriented to  Time, Oriented to Situation Alcohol / Substance Use: Not Applicable Psych Involvement: No (comment)  Admission diagnosis:  DKA (diabetic ketoacidosis) ([place]) [E11.10] Troponin I above reference range [R77.8] Diabetic ketoacidosis without coma associated with type 1 diabetes mellitus ([place]) [E10.10] Patient Active Problem List   Diagnosis Date Noted   DKA (diabetic ketoacidosis) ([place]) 01/30/2021   Essential hypertension 01/30/2021   Dementia ([place]) 01/30/2021   COPD (chronic obstructive pulmonary disease) ([place]) 01/30/2021   Hyperlipidemia 01/30/2021   Elevated troponin 01/30/2021   PCP:  System, Provider Not In Pharmacy:  No Pharmacies Listed    Social Determinants of Health (SDOH) Interventions    Readmission Risk Interventions No flowsheet data found.

## 2021-02-01 NOTE — NC FL2 (Signed)
  [place] LEVEL OF CARE SCREENING TOOL     IDENTIFICATION  Patient Name: Katelyn Arroyo Birthdate: 16-Apr-1929 Sex: female Admission Date (Current Location): 01/30/2021  [facility] and [place] Number:  Engineering geologist and Address:         Provider Number: [id number]  Attending Physician Name and Address:  Sidney Ace, MD  Relative Name and Phone Number:       Current Level of Care: Hospital Recommended Level of Care: [place] Prior Approval Number:    Date Approved/Denied:   PASRR Number:    Discharge Plan: Other (Comment) (ALF)    Current Diagnoses: Patient Active Problem List   Diagnosis Date Noted   DKA (diabetic ketoacidosis) ([place]) 01/30/2021   Essential hypertension 01/30/2021   Dementia ([place]) 01/30/2021   COPD (chronic obstructive pulmonary disease) ([place]) 01/30/2021   Hyperlipidemia 01/30/2021   Elevated troponin 01/30/2021    Orientation RESPIRATION BLADDER Height & Weight     Self, Time, Situation, Place  Normal Continent Weight: 68 kg Height:     BEHAVIORAL SYMPTOMS/MOOD NEUROLOGICAL BOWEL NUTRITION STATUS      Continent Diet (Carb modified)  AMBULATORY STATUS COMMUNICATION OF NEEDS Skin   Independent Verbally Normal                       Personal Care Assistance Level of Assistance              Functional Limitations Info             SPECIAL CARE FACTORS FREQUENCY                       Contractures Contractures Info: Not present    Additional Factors Info  Insulin Sliding Scale, Code Status, Allergies Code Status Info: dnr Allergies Info: Ceftriaxone   Insulin Sliding Scale Info: see med list       Medication List       TAKE these medications     acetaminophen 325 MG tablet Commonly known as: TYLENOL Take 650 mg by mouth every 6 (six) hours as needed for mild pain.    donepezil 10 MG tablet Commonly known as: ARICEPT Take 10 mg by mouth at bedtime.    insulin aspart  100 UNIT/ML injection Commonly known as: novoLOG Inject 4-10 Units into the skin 3 (three) times daily with meals. Inject 10 units subcutaneously in the morning, 7 units with lunch and 4 units in the evening (give with a snack/meal)    insulin glargine 100 UNIT/ML injection Commonly known as: LANTUS Inject 10 Units into the skin daily.    levothyroxine 125 MCG tablet Commonly known as: SYNTHROID Take 125 mcg by mouth daily before breakfast.    Melatonin 10 MG Tabs Take 10 mg by mouth at bedtime.    memantine 10 MG tablet Commonly known as: NAMENDA Take 10 mg by mouth 2 (two) times daily.    senna-docusate 8.6-50 MG tablet Commonly known as: Senokot-S Take 1 tablet by mouth every 12 (twelve) hours.    sertraline 25 MG tablet Commonly known as: ZOLOFT Take 25 mg by mouth at bedtime.  Relevant Imaging Results:  Relevant Lab Results:   Additional Information    Beverly Sessions, RN

## 2021-03-04 ENCOUNTER — Emergency Department: Payer: Medicare Other

## 2021-03-04 ENCOUNTER — Inpatient Hospital Stay
Admission: EM | Admit: 2021-03-04 | Discharge: 2021-03-15 | DRG: 312 | Disposition: A | Discharge status: Home / Self Care | Payer: Medicare Other | Attending: Internal Medicine | Admitting: Internal Medicine

## 2021-03-04 ENCOUNTER — Encounter: Payer: Self-pay | Admitting: Radiology

## 2021-03-04 ENCOUNTER — Other Ambulatory Visit: Payer: Self-pay

## 2021-03-04 ENCOUNTER — Observation Stay: Payer: Medicare Other

## 2021-03-04 DIAGNOSIS — Z79899 Other long term (current) drug therapy: Secondary | ICD-10-CM

## 2021-03-04 DIAGNOSIS — I959 Hypotension, unspecified: Secondary | ICD-10-CM | POA: Diagnosis present

## 2021-03-04 DIAGNOSIS — F419 Anxiety disorder, unspecified: Secondary | ICD-10-CM | POA: Diagnosis present

## 2021-03-04 DIAGNOSIS — I129 Hypertensive chronic kidney disease with stage 1 through stage 4 chronic kidney disease, or unspecified chronic kidney disease: Secondary | ICD-10-CM | POA: Diagnosis present

## 2021-03-04 DIAGNOSIS — Z79890 Hormone replacement therapy (postmenopausal): Secondary | ICD-10-CM

## 2021-03-04 DIAGNOSIS — J449 Chronic obstructive pulmonary disease, unspecified: Secondary | ICD-10-CM | POA: Diagnosis present

## 2021-03-04 DIAGNOSIS — R4182 Altered mental status, unspecified: Secondary | ICD-10-CM

## 2021-03-04 DIAGNOSIS — Z794 Long term (current) use of insulin: Secondary | ICD-10-CM

## 2021-03-04 DIAGNOSIS — R911 Solitary pulmonary nodule: Secondary | ICD-10-CM | POA: Diagnosis present

## 2021-03-04 DIAGNOSIS — E872 Acidosis, unspecified: Secondary | ICD-10-CM | POA: Diagnosis present

## 2021-03-04 DIAGNOSIS — S329XXA Fracture of unspecified parts of lumbosacral spine and pelvis, initial encounter for closed fracture: Secondary | ICD-10-CM

## 2021-03-04 DIAGNOSIS — S3210XA Unspecified fracture of sacrum, initial encounter for closed fracture: Secondary | ICD-10-CM

## 2021-03-04 DIAGNOSIS — Z87891 Personal history of nicotine dependence: Secondary | ICD-10-CM

## 2021-03-04 DIAGNOSIS — Z66 Do not resuscitate: Secondary | ICD-10-CM | POA: Diagnosis present

## 2021-03-04 DIAGNOSIS — E1165 Type 2 diabetes mellitus with hyperglycemia: Secondary | ICD-10-CM | POA: Diagnosis present

## 2021-03-04 DIAGNOSIS — S32119A Unspecified Zone I fracture of sacrum, initial encounter for closed fracture: Secondary | ICD-10-CM | POA: Diagnosis present

## 2021-03-04 DIAGNOSIS — E875 Hyperkalemia: Secondary | ICD-10-CM | POA: Diagnosis present

## 2021-03-04 DIAGNOSIS — S22020A Wedge compression fracture of second thoracic vertebra, initial encounter for closed fracture: Secondary | ICD-10-CM

## 2021-03-04 DIAGNOSIS — N184 Chronic kidney disease, stage 4 (severe): Secondary | ICD-10-CM | POA: Diagnosis not present

## 2021-03-04 DIAGNOSIS — R55 Syncope and collapse: Principal | ICD-10-CM | POA: Diagnosis present

## 2021-03-04 DIAGNOSIS — I10 Essential (primary) hypertension: Secondary | ICD-10-CM | POA: Diagnosis present

## 2021-03-04 DIAGNOSIS — W19XXXA Unspecified fall, initial encounter: Secondary | ICD-10-CM | POA: Diagnosis present

## 2021-03-04 DIAGNOSIS — S3282XA Multiple fractures of pelvis without disruption of pelvic ring, initial encounter for closed fracture: Secondary | ICD-10-CM

## 2021-03-04 DIAGNOSIS — M4854XA Collapsed vertebra, not elsewhere classified, thoracic region, initial encounter for fracture: Secondary | ICD-10-CM | POA: Diagnosis present

## 2021-03-04 DIAGNOSIS — E119 Type 2 diabetes mellitus without complications: Secondary | ICD-10-CM

## 2021-03-04 DIAGNOSIS — E039 Hypothyroidism, unspecified: Secondary | ICD-10-CM | POA: Diagnosis present

## 2021-03-04 DIAGNOSIS — F0393 Unspecified dementia, unspecified severity, with mood disturbance: Secondary | ICD-10-CM | POA: Diagnosis present

## 2021-03-04 DIAGNOSIS — T1490XA Injury, unspecified, initial encounter: Secondary | ICD-10-CM

## 2021-03-04 DIAGNOSIS — F0390 Unspecified dementia without behavioral disturbance: Secondary | ICD-10-CM | POA: Diagnosis present

## 2021-03-04 DIAGNOSIS — S32591A Other specified fracture of right pubis, initial encounter for closed fracture: Secondary | ICD-10-CM | POA: Diagnosis present

## 2021-03-04 DIAGNOSIS — R739 Hyperglycemia, unspecified: Secondary | ICD-10-CM

## 2021-03-04 DIAGNOSIS — U071 COVID-19: Secondary | ICD-10-CM

## 2021-03-04 DIAGNOSIS — E785 Hyperlipidemia, unspecified: Secondary | ICD-10-CM | POA: Diagnosis present

## 2021-03-04 DIAGNOSIS — N189 Chronic kidney disease, unspecified: Secondary | ICD-10-CM

## 2021-03-04 DIAGNOSIS — S32519A Fracture of superior rim of unspecified pubis, initial encounter for closed fracture: Secondary | ICD-10-CM

## 2021-03-04 LAB — COMPREHENSIVE METABOLIC PANEL
ALT: 13 U/L (ref 0–44)
AST: 17 U/L (ref 15–41)
Albumin: 3.3 g/dL — ABNORMAL LOW (ref 3.5–5.0)
Alkaline Phosphatase: 80 U/L (ref 38–126)
Anion gap: 12 (ref 5–15)
BUN: 37 mg/dL — ABNORMAL HIGH (ref 8–23)
CO2: 24 mmol/L (ref 22–32)
Calcium: 8.4 mg/dL — ABNORMAL LOW (ref 8.9–10.3)
Chloride: 101 mmol/L (ref 98–111)
Creatinine, Ser: 2.3 mg/dL — ABNORMAL HIGH (ref 0.44–1.00)
GFR, Estimated: 20 mL/min — ABNORMAL LOW (ref >60)
Glucose, Bld: 167 mg/dL — ABNORMAL HIGH (ref 70–99)
Potassium: 4.5 mmol/L (ref 3.5–5.1)
Sodium: 137 mmol/L (ref 135–145)
Total Bilirubin: 0.9 mg/dL (ref 0.3–1.2)
Total Protein: 5.9 g/dL — ABNORMAL LOW (ref 6.5–8.1)

## 2021-03-04 LAB — URINALYSIS, COMPLETE (UACMP) WITH MICROSCOPIC
Bilirubin Urine: NEGATIVE
Glucose, UA: 50 mg/dL — AB
Hgb urine dipstick: NEGATIVE
Ketones, ur: 5 mg/dL — AB
Leukocytes,Ua: NEGATIVE
Nitrite: NEGATIVE
Protein, ur: >=300 mg/dL — AB
Specific Gravity, Urine: 1.033 — ABNORMAL HIGH (ref 1.005–1.030)
Squamous Epithelial / HPF: NONE SEEN (ref 0–5)
pH: 5 (ref 5.0–8.0)

## 2021-03-04 LAB — MAGNESIUM: Magnesium: 2 mg/dL (ref 1.7–2.4)

## 2021-03-04 LAB — CBC WITH DIFFERENTIAL/PLATELET
Abs Immature Granulocytes: 0.11 10*3/uL — ABNORMAL HIGH (ref 0.00–0.07)
Basophils Absolute: 0.1 10*3/uL (ref 0.0–0.1)
Basophils Relative: 1 %
Eosinophils Absolute: 0.1 10*3/uL (ref 0.0–0.5)
Eosinophils Relative: 2 %
HCT: 33.6 % — ABNORMAL LOW (ref 36.0–46.0)
Hemoglobin: 11.2 g/dL — ABNORMAL LOW (ref 12.0–15.0)
Immature Granulocytes: 2 %
Lymphocytes Relative: 18 %
Lymphs Abs: 1.3 10*3/uL (ref 0.7–4.0)
MCH: 29.3 pg (ref 26.0–34.0)
MCHC: 33.3 g/dL (ref 30.0–36.0)
MCV: 88 fL (ref 80.0–100.0)
Monocytes Absolute: 0.5 10*3/uL (ref 0.1–1.0)
Monocytes Relative: 7 %
Neutro Abs: 5 10*3/uL (ref 1.7–7.7)
Neutrophils Relative %: 70 %
Platelets: 244 10*3/uL (ref 150–400)
RBC: 3.82 MIL/uL — ABNORMAL LOW (ref 3.87–5.11)
RDW: 13.2 % (ref 11.5–15.5)
WBC: 7.1 10*3/uL (ref 4.0–10.5)
nRBC: 0.3 % — ABNORMAL HIGH (ref 0.0–0.2)

## 2021-03-04 LAB — LACTIC ACID, PLASMA
Lactic Acid, Venous: 2.6 mmol/L (ref 0.5–1.9)
Lactic Acid, Venous: 1.5 mmol/L (ref 0.5–1.9)

## 2021-03-04 LAB — CBG MONITORING, ED
Glucose-Capillary: 166 mg/dL — ABNORMAL HIGH (ref 70–99)
Glucose-Capillary: 161 mg/dL — ABNORMAL HIGH (ref 70–99)
Glucose-Capillary: 144 mg/dL — ABNORMAL HIGH (ref 70–99)

## 2021-03-04 LAB — TROPONIN I (HIGH SENSITIVITY)
Troponin I (High Sensitivity): 17 ng/L (ref <18)
Troponin I (High Sensitivity): 19 ng/L — ABNORMAL HIGH (ref <18)

## 2021-03-04 LAB — RESP PANEL BY RT-PCR (FLU A&B, COVID) ARPGX2
Influenza A by PCR: NEGATIVE
Influenza B by PCR: NEGATIVE
SARS Coronavirus 2 by RT PCR: POSITIVE — AB

## 2021-03-04 LAB — APTT: aPTT: 25 seconds (ref 24–36)

## 2021-03-04 LAB — BRAIN NATRIURETIC PEPTIDE: B Natriuretic Peptide: 321.4 pg/mL — ABNORMAL HIGH (ref 0.0–100.0)

## 2021-03-04 LAB — PROTIME-INR
INR: 0.9 (ref 0.8–1.2)
Prothrombin Time: 12.5 seconds (ref 11.4–15.2)

## 2021-03-04 MED ORDER — INSULIN ASPART 100 UNIT/ML IJ SOLN
0.0000 [IU] | Freq: Three times a day (TID) | INTRAMUSCULAR | Status: DC
  Administered 2021-03-04: 1 [IU] via SUBCUTANEOUS
  Administered 2021-03-05: 2 [IU] via SUBCUTANEOUS
  Administered 2021-03-05: 9 [IU] via SUBCUTANEOUS
  Administered 2021-03-06 (×2): 2 [IU] via SUBCUTANEOUS
  Administered 2021-03-06 – 2021-03-07 (×3): 5 [IU] via SUBCUTANEOUS
  Administered 2021-03-07: 2 [IU] via SUBCUTANEOUS
  Administered 2021-03-08 (×2): 3 [IU] via SUBCUTANEOUS
  Administered 2021-03-08: 2 [IU] via SUBCUTANEOUS
  Administered 2021-03-09: 7 [IU] via SUBCUTANEOUS
  Administered 2021-03-09: 3 [IU] via SUBCUTANEOUS
  Administered 2021-03-10: 5 [IU] via SUBCUTANEOUS
  Administered 2021-03-10: 3 [IU] via SUBCUTANEOUS
  Administered 2021-03-11: 1 [IU] via SUBCUTANEOUS
  Administered 2021-03-11: 3 [IU] via SUBCUTANEOUS
  Administered 2021-03-11: 1 [IU] via SUBCUTANEOUS
  Administered 2021-03-12: 2 [IU] via SUBCUTANEOUS
  Administered 2021-03-12: 5 [IU] via SUBCUTANEOUS
  Administered 2021-03-13: 3 [IU] via SUBCUTANEOUS
  Administered 2021-03-13: 2 [IU] via SUBCUTANEOUS
  Administered 2021-03-13: 1 [IU] via SUBCUTANEOUS
  Administered 2021-03-14: 7 [IU] via SUBCUTANEOUS
  Administered 2021-03-14: 3 [IU] via SUBCUTANEOUS
  Administered 2021-03-15: 1 [IU] via SUBCUTANEOUS
  Administered 2021-03-15: 3 [IU] via SUBCUTANEOUS
  Administered 2021-03-15: 7 [IU] via SUBCUTANEOUS
  Filled 2021-03-04 (×24): qty 1

## 2021-03-04 MED ORDER — OXYCODONE-ACETAMINOPHEN 5-325 MG PO TABS
1.0000 | ORAL_TABLET | Freq: Four times a day (QID) | ORAL | Status: DC | PRN
Start: 2021-03-04 — End: 2021-03-16
  Administered 2021-03-04 – 2021-03-15 (×7): 1 via ORAL
  Filled 2021-03-04 (×8): qty 1

## 2021-03-04 MED ORDER — IOHEXOL 300 MG/ML  SOLN
100.0000 mL | Freq: Once | INTRAMUSCULAR | Status: AC | PRN
  Administered 2021-03-04: 75 mL via INTRAVENOUS

## 2021-03-04 MED ORDER — ACETAMINOPHEN 325 MG PO TABS
650.0000 mg | ORAL_TABLET | Freq: Four times a day (QID) | ORAL | Status: DC | PRN
  Administered 2021-03-12: 650 mg via ORAL
  Filled 2021-03-04 (×2): qty 2

## 2021-03-04 MED ORDER — SODIUM CHLORIDE 0.9% FLUSH
3.0000 mL | Freq: Two times a day (BID) | INTRAVENOUS | Status: DC
  Administered 2021-03-04 – 2021-03-15 (×22): 3 mL via INTRAVENOUS

## 2021-03-04 MED ORDER — SODIUM CHLORIDE 0.9 % IV BOLUS
1000.0000 mL | Freq: Once | INTRAVENOUS | Status: AC
  Administered 2021-03-04: 1000 mL via INTRAVENOUS

## 2021-03-04 MED ORDER — SERTRALINE HCL 50 MG PO TABS
25.0000 mg | ORAL_TABLET | Freq: Every day | ORAL | Status: DC
  Administered 2021-03-04 – 2021-03-15 (×12): 25 mg via ORAL
  Filled 2021-03-04 (×12): qty 1

## 2021-03-04 MED ORDER — MORPHINE SULFATE (PF) 2 MG/ML IV SOLN
1.0000 mg | INTRAVENOUS | Status: DC | PRN
Start: 2021-03-04 — End: 2021-03-06
  Administered 2021-03-04: 1 mg via INTRAVENOUS
  Filled 2021-03-04: qty 1

## 2021-03-04 MED ORDER — LEVOTHYROXINE SODIUM 50 MCG PO TABS
125.0000 ug | ORAL_TABLET | Freq: Every day | ORAL | Status: DC
  Administered 2021-03-05 – 2021-03-14 (×10): 125 ug via ORAL
  Filled 2021-03-04: qty 1
  Filled 2021-03-04: qty 3
  Filled 2021-03-04 (×10): qty 1

## 2021-03-04 MED ORDER — POLYETHYLENE GLYCOL 3350 17 G PO PACK: 17.0000 g | PACK | Freq: Every day | ORAL | Status: DC | PRN

## 2021-03-04 MED ORDER — HEPARIN SODIUM (PORCINE) 5000 UNIT/ML IJ SOLN
5000.0000 [IU] | Freq: Three times a day (TID) | INTRAMUSCULAR | Status: DC
  Administered 2021-03-04 – 2021-03-15 (×34): 5000 [IU] via SUBCUTANEOUS
  Filled 2021-03-04 (×33): qty 1

## 2021-03-04 MED ORDER — DOCUSATE SODIUM 100 MG PO CAPS
200.0000 mg | ORAL_CAPSULE | Freq: Two times a day (BID) | ORAL | Status: DC | PRN
Start: 2021-03-04 — End: 2021-03-16
  Administered 2021-03-07: 200 mg via ORAL
  Filled 2021-03-04: qty 2

## 2021-03-04 MED ORDER — IOHEXOL 350 MG/ML SOLN: 100.0000 mL | Freq: Once | INTRAVENOUS | Status: DC | PRN

## 2021-03-04 MED ORDER — DONEPEZIL HCL 5 MG PO TABS
10.0000 mg | ORAL_TABLET | Freq: Every day | ORAL | Status: DC
  Administered 2021-03-04 – 2021-03-15 (×12): 10 mg via ORAL
  Filled 2021-03-04 (×12): qty 2

## 2021-03-04 MED ORDER — ONDANSETRON HCL 4 MG/2ML IJ SOLN
4.0000 mg | Freq: Four times a day (QID) | INTRAMUSCULAR | Status: DC | PRN
  Administered 2021-03-04: 4 mg via INTRAVENOUS
  Filled 2021-03-04: qty 2

## 2021-03-04 MED ORDER — INSULIN ASPART 100 UNIT/ML IJ SOLN: 0.0000 [IU] | INTRAMUSCULAR | Status: DC

## 2021-03-04 MED ORDER — MEMANTINE HCL 5 MG PO TABS
10.0000 mg | ORAL_TABLET | Freq: Two times a day (BID) | ORAL | Status: DC
  Administered 2021-03-04 – 2021-03-15 (×23): 10 mg via ORAL
  Filled 2021-03-04 (×24): qty 2

## 2021-03-04 MED ORDER — LACTATED RINGERS IV BOLUS: 1000.0000 mL | Freq: Once | INTRAVENOUS | Status: DC

## 2021-03-04 MED ORDER — SODIUM CHLORIDE 0.9 % IV SOLN: INTRAVENOUS | Status: DC

## 2021-03-04 NOTE — ED Provider Notes (Signed)
[facility] Emergency Department Provider Note  ____________________________________________   Event Date/Time   First MD Initiated Contact with Patient 03/04/21 1303     (approximate)  I have reviewed the triage vital signs and the nursing notes.   HISTORY  Chief Complaint Loss of Consciousness   HPI Katelyn Arroyo is a 87 y.o. female with medical history significant for insulin-dependent diabetes mellitus, hypothyroid, depression/anxiety, dementia, hypothyroid and recent admission for DKA earlier this year who presents via EMS from nursing facility after she was found unresponsive in her shower facedown.  No further history is available from staff and per EMS very limited history from staff on arrival.  Patient is unable to find any report secondary to altered mental status.  Per EMS she was hypotensive with systolics of 78 received 387 cc of saline in route.  She otherwise has not appropriate SPO2 and her glucose was above 100.  No other history is available from EMS.  Did attempt to call facility x1 and daughter x1 was unable to reach family.  No other history is [name] available on presentation.         Past Medical History:  Diagnosis Date   Diabetes mellitus without complication [facility])     Patient Active Problem List   Diagnosis Date Noted   DKA (diabetic ketoacidosis) ([place]) 01/30/2021   Essential hypertension 01/30/2021   Dementia ([place]) 01/30/2021   COPD (chronic obstructive pulmonary disease) ([place]) 01/30/2021   Hyperlipidemia 01/30/2021   Elevated troponin 01/30/2021    History reviewed. No pertinent surgical history.  Prior to Admission medications   Medication Sig Start Date End Date Taking? Authorizing Provider  acetaminophen (TYLENOL) 325 MG tablet Take 650 mg by mouth every 6 (six) hours as needed for mild pain.    [provider]  donepezil (ARICEPT) 10 MG tablet Take 10 mg by mouth at bedtime.    [provider]   insulin aspart (NOVOLOG) 100 UNIT/ML injection Inject 4-10 Units into the skin 3 (three) times daily with meals. Inject 10 units subcutaneously in the morning, 7 units with lunch and 4 units in the evening (give with a snack/meal)    [provider]  insulin glargine (LANTUS) 100 UNIT/ML injection Inject 10 Units into the skin daily.    [provider]  levothyroxine (SYNTHROID) 125 MCG tablet Take 125 mcg by mouth daily before breakfast.    [provider]  Melatonin 10 MG TABS Take 10 mg by mouth at bedtime.    [provider]  memantine (NAMENDA) 10 MG tablet Take 10 mg by mouth 2 (two) times daily.    [provider]  senna-docusate (SENOKOT-S) 8.6-50 MG tablet Take 1 tablet by mouth every 12 (twelve) hours.    [provider]  sertraline (ZOLOFT) 25 MG tablet Take 25 mg by mouth at bedtime.    [provider]    Allergies Ceftriaxone  Family History  Problem Relation Age of Onset   Heart disease Father    Heart disease Other     Social History Social History   Tobacco Use   Smoking status: Former    Types: Cigarettes   Smokeless tobacco: Never  Substance Use Topics   Alcohol use: Not Currently   Drug use: Never    Review of Systems  Review of Systems  Unable to perform ROS: Mental status change     ____________________________________________   PHYSICAL EXAM:  VITAL SIGNS: ED Triage Vitals  Enc Vitals Group  BP      Pulse      Resp      Temp      Temp src      SpO2      Weight      Height      Head Circumference      Peak Flow      Pain Score      Pain Loc      Pain Edu?      Excl. in [place]?    Vitals:   03/04/21 1423 03/04/21 1430  BP:  (!) 163/68  Pulse: 70 70  Resp:    Temp:    SpO2:  99%   Physical Exam Vitals and nursing note reviewed.  Constitutional:      General: She is in acute distress.     Appearance: She is well-developed. She is ill-appearing and diaphoretic.   HENT:     Head: Normocephalic and atraumatic.     Right Ear: External ear normal.     Left Ear: External ear normal.     Nose: Nose normal.     Mouth/Throat:     Mouth: Mucous membranes are dry.  Eyes:     Conjunctiva/sclera: Conjunctivae normal.  Cardiovascular:     Rate and Rhythm: Normal rate and regular rhythm.     Heart sounds: No murmur heard. Pulmonary:     Effort: Pulmonary effort is normal. No respiratory distress.     Breath sounds: Normal breath sounds.  Abdominal:     Palpations: Abdomen is soft.     Tenderness: There is no abdominal tenderness.  Musculoskeletal:     Cervical back: Neck supple.  Skin:    General: Skin is warm.  Neurological:     Mental Status: She is alert. She is disoriented.    Cranial nerves II through XII are grossly intact.  Patient is able to intermittently follow commands with her thumbs and toes but does not otherwise participate in neurological exam.  She is not oriented or able to write any additional history.  She notes she is not in significant pain.  Does not seem to be significant tenderness over the C/T/L-spine.  2+ radial and DP pulses.  No effusion, deformity, or point tenderness or other obvious trauma to the bilateral shoulders, elbows, wrists, hips, knees or ankles. ____________________________________________   LABS (all labs ordered are listed, but only abnormal results are displayed)  Labs Reviewed  LACTIC ACID, PLASMA - Abnormal; Notable for the following components:      Result Value   Lactic Acid, Venous 2.6 (*)    All other components within normal limits  COMPREHENSIVE METABOLIC PANEL - Abnormal; Notable for the following components:   Glucose, Bld 167 (*)    BUN 37 (*)    Creatinine, Ser 2.30 (*)    Calcium 8.4 (*)    Total Protein 5.9 (*)    Albumin 3.3 (*)    GFR, Estimated 20 (*)    All other components within normal limits  CBC WITH DIFFERENTIAL/PLATELET - Abnormal; Notable for the following components:    RBC 3.82 (*)    Hemoglobin 11.2 (*)    HCT 33.6 (*)    nRBC 0.3 (*)    Abs Immature Granulocytes 0.11 (*)    All other components within normal limits  CBG MONITORING, ED - Abnormal; Notable for the following components:   Glucose-Capillary 166 (*)    All other components within normal limits  RESP PANEL BY RT-PCR (  FLU A&B, COVID) ARPGX2  CULTURE, BLOOD (SINGLE)  URINE CULTURE  MAGNESIUM  PROTIME-INR  APTT  LACTIC ACID, PLASMA  URINALYSIS, COMPLETE (UACMP) WITH MICROSCOPIC  BLOOD GAS, VENOUS  BRAIN NATRIURETIC PEPTIDE  TROPONIN I (HIGH SENSITIVITY)  TROPONIN I (HIGH SENSITIVITY)   ____________________________________________  EKG  EKG shows sinus rhythm with a ventricular rate of 75, unremarkable intervals and some nonspecific ST changes in inferior lateral leads versus lead placement without other clearance of acute ischemia or significant arrhythmia. ____________________________________________  RADIOLOGY  ED MD interpretation:  Chest x-ray shows stable cardiomegaly and chronic picture changes without evidence of rib fracture, pneumothorax, focal consolidation, large effusion or other clear acute thoracic process.  Pelvis x-ray shows no acute fracture or dislocation.  CT head without evidence of skull fracture, intracranial hemorrhage or subacute ischemia.  There is evidence of chronic small vessel ischemic changes and cerebral atrophy.  No clear acute C-spine fracture.  There is some mild height loss of C7 likely chronic.  There are some degenerative changes noted.  CT reformats of the T and L-spine show age-indeterminate compression deformity of T2 without retropulsion and some surgical changes at L1 remote burst fracture with some chronic bony retropulsion.  No other acute spine fracture visualized.  CTA chest abdomen pelvis remarkable for a nondisplaced right superior pubic rami fracture and a right sacral fracture.  There is also again noted compression deformity at  T2 without other acute injury.  No evidence of PE, large AAA, focal consolidation suggestive of bacterial pneumonia although it is a 7 mm right apical groundglass nodule possibly infectious or inflammatory related.  No other acute abdominopelvic process noted.    Official radiology report(s): CT HEAD WO CONTRAST (5MM)  Result Date: 03/04/2021 CLINICAL DATA:  Head trauma, minor (Age >= 65y) EXAM: CT HEAD WITHOUT CONTRAST TECHNIQUE: Contiguous axial images were obtained from the base of the skull through the vertex without intravenous contrast. COMPARISON:  None. FINDINGS: Brain: No evidence of acute intracranial hemorrhage or extra-axial collection.No evidence of mass lesion/concern mass effect.The ventricles appear mildly enlarged but proportional to degree of cerebral atrophy.Confluent periventricular and subcortical white matter hypoattenuation, which is nonspecific but likely sequela of chronic small vessel ischemic disease.Mild cerebral atrophy Vascular: Bilateral carotid calcifications. Skull: Normal. Negative for fracture or focal lesion. Sinuses/Orbits: No acute finding. Other: None. IMPRESSION: No acute intracranial abnormality. Sequela of chronic small vessel ischemic disease and cerebral atrophy. Electronically Signed   By: Maurine Simmering M.D.   On: 03/04/2021 14:24   CT Cervical Spine Wo Contrast  Result Date: 03/04/2021 CLINICAL DATA:  Neck trauma (Age >= 65y) EXAM: CT CERVICAL SPINE WITHOUT CONTRAST TECHNIQUE: Multidetector CT imaging of the cervical spine was performed without intravenous contrast. Multiplanar CT image reconstructions were also generated. COMPARISON:  None. FINDINGS: Alignment: Straightening of the normal cervical lordosis. Trace anterolisthesis at C4-C5. Skull base and vertebrae: There is no evidence of acute cervical spine fracture. Mild anterior height loss of C7 which is likely chronic. There is no aggressive osseous lesion. Soft tissues and spinal canal: No prevertebral  fluid or swelling. No visible canal hematoma. Disc levels: Multilevel degenerative disc disease, severe at C5-C6 and moderate at C6-C7 and C7-T1. There is multilevel facet arthropathy bilaterally. Upper chest: Please see same-day separately dictated CT of the chest. Other: Atrophic thyroid. IMPRESSION: No acute cervical spine fracture. Mild anterior height loss of C7 which is favored to be chronic. Multilevel degenerative disc disease, worst from C5-T1. Electronically Signed   By: Ileene Patrick.D.  On: 03/04/2021 14:28   DG Pelvis Portable  Result Date: 03/04/2021 CLINICAL DATA:  Altered mental status, questionable sepsis EXAM: PORTABLE PELVIS 1-2 VIEWS COMPARISON:  Portable exam 1317 hours without priors for comparison FINDINGS: Osseous demineralization. Hip and SI joint spaces preserved. No acute fracture, dislocation, or bone destruction. Scattered atherosclerotic calcifications. IMPRESSION: No acute abnormalities. Electronically Signed   By: Lavonia Dana M.D.   On: 03/04/2021 13:52   CT CHEST ABDOMEN PELVIS W CONTRAST  Result Date: 03/04/2021 CLINICAL DATA:  F fall, unresponsive all moderate sponsored EXAM: CT CHEST, ABDOMEN, AND PELVIS WITH CONTRAST TECHNIQUE: Multidetector CT imaging of the chest, abdomen and pelvis was performed following the standard protocol during bolus administration of intravenous contrast. CONTRAST:  34mL OMNIPAQUE IOHEXOL 300 MG/ML  SOLN COMPARISON:  None. FINDINGS: CT CHEST FINDINGS Cardiovascular: Normal cardiac size.No pericardial disease.Coronary artery and mitral annular calcifications.Mild atherosclerotic calcifications of the thoracic aorta. Mediastinum/Nodes: No lymphadenopathy.Atrophic thyroid.Patulous esophagus with intraluminal gas and fluid. Lungs/Pleura: There is a 7 mm right apical ground-glass nodular opacity (series 4, image 24). There is no other focal airspace disease. There is atelectasis in the lingula and right middle lobe. Scattered lung scarring. No  pleural effusion or pneumothorax. Musculoskeletal: Age-indeterminate central compression deformity of T2.Multilevel degenerative changes of the spine. Osteopenia. No suspicious lytic or blastic lesions. CT ABDOMEN PELVIS FINDINGS Hepatobiliary: No hepatic injury or perihepatic hematoma. Multiple calcified hepatic granulomas. The gallbladder is unremarkable. Pancreas: Unremarkable. No pancreatic ductal dilatation or surrounding inflammatory changes. Spleen: No splenic injury or perisplenic hematoma. Multiple splenic granulomas. Adrenals/Urinary Tract: No adrenal hemorrhage or renal injury identified. Bladder is unremarkable. No renal laceration. No hydronephrosis or nephrolithiasis. Small left renal cyst, subcentimeter. Bladder is mildly distended with wall thickening likely related to underdistention. Stomach/Bowel: The stomach is within normal limits. There is no evidence of bowel obstruction. The appendix is normal. No acute inflammatory process. No evidence of acute bowel injury. Sigmoid diverticulosis. Vascular/Lymphatic: Aorta iliac atherosclerotic calcifications. No AAA. No lymphadenopathy. Reproductive: Unremarkable. Other: No abdominal wall hernia or abnormality. No abdominopelvic ascites. Musculoskeletal: There is a nondisplaced right superior pubic ramus/pubic body fracture. Nondisplaced right sacral fracture in zone 1 (series 2, images 91-93) prior L1 vertebral plasty for an L1 burst fracture, as described on separately dictated lumbar spine CT. There is multilevel degenerative disc disease, worst at L2-L3 and L3-L4. IMPRESSION: Nondisplaced right superior pubic ramus/pubic body fracture. Nondisplaced right sacral fracture in zone 1. Age-indeterminate central compression deformity of T2, as described on separately dictated thoracic spine CT. No evidence of acute solid organ injury. Chronic findings as described above. 7 mm right apical ground-glass pulmonary nodule, which is likely  infectious/inflammatory. Recommend a non-contrast Chest CT at 6-12 months to confirm persistence, then additional non-contrast Chest CTs every 2 years until 5 years. If nodule grows or develops solid component(s), consider resection. These guidelines do not apply to immunocompromised patients and patients with cancer. Follow up in patients with significant comorbidities as clinically warranted. Reference: Radiology. 2017; 284(1):228-43. Electronically Signed   By: Maurine Simmering M.D.   On: 03/04/2021 14:44   CT T-SPINE NO CHARGE  Result Date: 03/04/2021 CLINICAL DATA:  Fall EXAM: CT Thoracic and Lumbar spine without contrast TECHNIQUE: Multiplanar CT images of the thoracic and lumbar spine were reconstructed from contemporary CT of the Chest, Abdomen, and Pelvis CONTRAST:  No dissection COMPARISON:  None FINDINGS: CT THORACIC SPINE FINDINGS Alignment: Physiologic Vertebrae: There is an age-indeterminate central compression deformity of T2 with approximately 30% height loss. No bony retropulsion.  No other evidence of thoracic spine fracture. Diffuse osteopenia. No aggressive osseous lesion/suspicious lytic or blastic lesions. Paraspinal and other soft tissues: Reported separately. Disc levels: Multilevel degenerate endplate changes and disc disease, most prominent at T2-T3. C7-T1 degenerative disc disease and facet arthropathy. CT LUMBAR SPINE FINDINGS Segmentation: Normal Alignment: Trace anterolisthesis at L3-L4. Vertebrae: Osteopenia. Prior L1 burst fracture with postprocedural changes of vertebroplasty. There is 5 mm bony retropulsion of the superior endplate which is presumably chronic. No priors for comparison. No other evidence of fracture. Paraspinal and other soft tissues: Reported separately Disc levels: There is multilevel degenerative disc disease, most severe at L2-L3 and L3-L4. There is moderate multilevel facet arthropathy. There is multilevel disc bulging with likely very degrees of mild-to-moderate  spinal canal and neural foraminal narrowing. IMPRESSION: THORACIC SPINE CT Age-indeterminate central compression deformity of T2 with approximately 30% height loss. No bony retropulsion. Multilevel degenerative disc disease, most prominent at T2-T3. LUMBAR SPINE CT Postprocedural changes of vertebroplasty for a prior L1 burst fracture. There is 5 mm bony retropulsion of the superior endplate which is presumably chronic, no priors for comparison. No other lumbar spine fracture. Multilevel degenerative disc disease and facet arthropathy with disc bulging, most severe at L2-L3 and L3-L4. Likely varying degrees of mild to moderate spinal canal and neural foraminal narrowing. Electronically Signed   By: Maurine Simmering M.D.   On: 03/04/2021 14:20   CT L-SPINE NO CHARGE  Result Date: 03/04/2021 CLINICAL DATA:  Fall EXAM: CT Thoracic and Lumbar spine without contrast TECHNIQUE: Multiplanar CT images of the thoracic and lumbar spine were reconstructed from contemporary CT of the Chest, Abdomen, and Pelvis CONTRAST:  No dissection COMPARISON:  None FINDINGS: CT THORACIC SPINE FINDINGS Alignment: Physiologic Vertebrae: There is an age-indeterminate central compression deformity of T2 with approximately 30% height loss. No bony retropulsion. No other evidence of thoracic spine fracture. Diffuse osteopenia. No aggressive osseous lesion/suspicious lytic or blastic lesions. Paraspinal and other soft tissues: Reported separately. Disc levels: Multilevel degenerate endplate changes and disc disease, most prominent at T2-T3. C7-T1 degenerative disc disease and facet arthropathy. CT LUMBAR SPINE FINDINGS Segmentation: Normal Alignment: Trace anterolisthesis at L3-L4. Vertebrae: Osteopenia. Prior L1 burst fracture with postprocedural changes of vertebroplasty. There is 5 mm bony retropulsion of the superior endplate which is presumably chronic. No priors for comparison. No other evidence of fracture. Paraspinal and other soft  tissues: Reported separately Disc levels: There is multilevel degenerative disc disease, most severe at L2-L3 and L3-L4. There is moderate multilevel facet arthropathy. There is multilevel disc bulging with likely very degrees of mild-to-moderate spinal canal and neural foraminal narrowing. IMPRESSION: THORACIC SPINE CT Age-indeterminate central compression deformity of T2 with approximately 30% height loss. No bony retropulsion. Multilevel degenerative disc disease, most prominent at T2-T3. LUMBAR SPINE CT Postprocedural changes of vertebroplasty for a prior L1 burst fracture. There is 5 mm bony retropulsion of the superior endplate which is presumably chronic, no priors for comparison. No other lumbar spine fracture. Multilevel degenerative disc disease and facet arthropathy with disc bulging, most severe at L2-L3 and L3-L4. Likely varying degrees of mild to moderate spinal canal and neural foraminal narrowing. Electronically Signed   By: Maurine Simmering M.D.   On: 03/04/2021 14:20   DG Chest Port 1 View  Result Date: 03/04/2021 CLINICAL DATA:  Altered mental status questionable sepsis EXAM: PORTABLE CHEST 1 VIEW COMPARISON:  Portable exam 1315 hours compared to 01/30/2021 FINDINGS: Enlargement of cardiac silhouette. Mediastinal contours and pulmonary vascularity normal. Atherosclerotic calcification aorta.  Chronic bronchitic changes. Lungs clear. No pulmonary infiltrate, pleural effusion, or pneumothorax. Small calcified granulomata at lower LEFT lung noted. Diffuse osseous demineralization with prior vertebroplasty at question L1. IMPRESSION: Enlargement of cardiac silhouette and chronic bronchitic changes. No acute abnormalities. Aortic Atherosclerosis (ICD10-I70.0). Electronically Signed   By: Lavonia Dana M.D.   On: 03/04/2021 13:52    ____________________________________________   PROCEDURES  Procedure(s) performed (including Critical Care):  .1-3 Lead EKG Interpretation Performed by: Lucrezia Starch, MD Authorized by: Lucrezia Starch, MD     Interpretation: non-specific     ECG rate assessment: normal     Rhythm: sinus rhythm     Ectopy: none     Conduction: normal     ____________________________________________   INITIAL IMPRESSION / ASSESSMENT AND PLAN / ED COURSE      Patient presents with above-stated history exam after being found passed out in her shower facedown.  She is reportedly hypotensive with EMS and on arrival she is noted to have a BP of 163/68 with otherwise stable vital signs on room air.  Does seem very confused and only able to intermittently follow commands.  There are no gross deformities to her extremities and abdomen seems relatively soft nontender.  Lungs are clear bilaterally.  There is not seem to be any significant tenderness over the spine or other obvious evidence of trauma.  Initial differential is quite broad and includes fall and syncope secondary to seizure, ACS, PE, arrhythmia, anemia, dehydration from GI losses, sepsis, CVA and endocrine derangements.  ECG and nonelevated troponin at 17 not suggestive of ACS or significant arrhythmia.  Chest x-ray shows stable cardiomegaly and chronic picture changes without evidence of rib fracture, pneumothorax, focal consolidation, large effusion or other clear acute thoracic process.  Pelvis x-ray shows no acute fracture or dislocation.  CT head without evidence of skull fracture, intracranial hemorrhage or subacute ischemia.  There is evidence of chronic small vessel ischemic changes and cerebral atrophy.  No clear acute C-spine fracture.  There is some mild height loss of C7 likely chronic.  There are some degenerative changes noted.  CT reformats of the T and L-spine show age-indeterminate compression deformity of T2 without retropulsion and some surgical changes at L1 remote burst fracture with some chronic bony retropulsion.  No other acute spine fracture visualized.  CTA chest abdomen  pelvis remarkable for a nondisplaced right superior pubic rami fracture and a right sacral fracture.  There is also again noted compression deformity at T2 without other acute injury.  No evidence of PE, large AAA, focal consolidation suggestive of bacterial pneumonia although it is a 7 mm right apical groundglass nodule possibly infectious or inflammatory related.  No other acute abdominopelvic process noted.  CBC without evidence of leukocytosis or acute anemia.  INR is within normal limits.  aPTT is within normal limits.  Magnesium 2.  Lactic acid 2.6.  CMP remarkable for glucose of 167, BUN 37, creatinine 2.3 compared to 2.89-month ago without other significant electrolyte or metabolic derangements..  Given absence of fever or leukocytosis and any clear source of infection on exam or CTs have a low suspicion for sepsis or acute infectious process at this time.  UA still pending.  Dr. Posey Pronto with orthopedic service consulted for above-noted pelvic fractures.  Care patient signed over to assuming provider at approximately 1500.  Plan is to follow-up with Ortho [facility] and admit to medicine for high risk syncope and fall with plan to admit here if Ortho determines  patient felt fractures are nonoperative.  In addition patient is UA to follow-up to assess for evidence of possible cystitis as precipitating his fall.  ____________________________________________   FINAL CLINICAL IMPRESSION(S) / ED DIAGNOSES  Final diagnoses:  Trauma  Multiple closed fractures of pelvis without disruption of pelvic ring, initial encounter ([place])  Fall, initial encounter  Altered mental status, unspecified altered mental status type  Chronic kidney disease, unspecified CKD stage  Hyperglycemia    Medications  lactated ringers bolus 1,000 mL (has no administration in time range)  insulin aspart (novoLOG) injection 0-15 Units (has no administration in time range)  iohexol (OMNIPAQUE) 300 MG/ML solution 100 mL (75 mLs  Intravenous Contrast Given 03/04/21 1336)     ED Discharge Orders     None        Note:  This document was prepared using Dragon voice recognition software and may include unintentional dictation errors.    Lucrezia Starch, MD 03/04/21 [phone]

## 2021-03-04 NOTE — Consult Note (Addendum)
ORTHOPAEDIC CONSULTATION  REQUESTING PHYSICIAN: No att. providers found  Chief Complaint:   Pelvic fractures  History of Present Illness: History obtained from discussion with medical providers and review of chart secondary to patient's altered mental status.  Katelyn Arroyo is a 87 y.o. female medical history significant for diabetes mellitus, hypothyroidism, depression/anxiety, dementia, and recent admission for DKA earlier this year who presented to ED from nursing facility after she was found unresponsive in her shower facedown.   Imaging in the emergency department showed normal radiographs with CT scan of the pelvis showing a minimally displaced right superior pubic ramus fracture and right sacral ala fracture.  Past Medical History:  Diagnosis Date   Diabetes mellitus without complication ([place])    History reviewed. No pertinent surgical history. Social History   Socioeconomic History   Marital status: Single    Spouse name: Not on file   Number of children: Not on file   Years of education: Not on file   Highest education level: Not on file  Occupational History   Not on file  Tobacco Use   Smoking status: Former    Types: Cigarettes   Smokeless tobacco: Never  Substance and Sexual Activity   Alcohol use: Not Currently   Drug use: Never   Sexual activity: Not Currently  Other Topics Concern   Not on file  Social History Narrative   Not on file   Social Determinants of Health   Financial Resource Strain: Not on file  Food Insecurity: Not on file  Transportation Needs: Not on file  Physical Activity: Not on file  Stress: Not on file  Social Connections: Not on file   Family History  Problem Relation Age of Onset   Heart disease Father    Heart disease Other    Allergies  Allergen Reactions   Ceftriaxone    Prior to Admission medications   Medication Sig Start Date End Date Taking?  Authorizing Provider  acetaminophen (TYLENOL) 325 MG tablet Take 650 mg by mouth every 6 (six) hours as needed for mild pain.    [provider]  donepezil (ARICEPT) 10 MG tablet Take 10 mg by mouth at bedtime.    [provider]  insulin aspart (NOVOLOG) 100 UNIT/ML injection Inject 4-10 Units into the skin 3 (three) times daily with meals. Inject 10 units subcutaneously in the morning, 7 units with lunch and 4 units in the evening (give with a snack/meal)    [provider]  insulin glargine (LANTUS) 100 UNIT/ML injection Inject 10 Units into the skin daily.    [provider]  levothyroxine (SYNTHROID) 125 MCG tablet Take 125 mcg by mouth daily before breakfast.    [provider]  Melatonin 10 MG TABS Take 10 mg by mouth at bedtime.    [provider]  memantine (NAMENDA) 10 MG tablet Take 10 mg by mouth 2 (two) times daily.    [provider]  senna-docusate (SENOKOT-S) 8.6-50 MG tablet Take 1 tablet by mouth every 12 (twelve) hours.    [provider]  sertraline (ZOLOFT) 25 MG tablet Take 25 mg by mouth at bedtime.    [provider]   Recent Labs    03/04/21 1414  WBC 7.1  HGB 11.2*  HCT 33.6*  PLT 244  K 4.5  CL 101  CO2 24  BUN 37*  CREATININE 2.30*  GLUCOSE 167*  CALCIUM 8.4*  INR 0.9   CT HEAD WO CONTRAST (5MM)  Result Date: 03/04/2021  CLINICAL DATA:  Head trauma, minor (Age >= 65y) EXAM: CT HEAD WITHOUT CONTRAST TECHNIQUE: Contiguous axial images were obtained from the base of the skull through the vertex without intravenous contrast. COMPARISON:  None. FINDINGS: Brain: No evidence of acute intracranial hemorrhage or extra-axial collection.No evidence of mass lesion/concern mass effect.The ventricles appear mildly enlarged but proportional to degree of cerebral atrophy.Confluent periventricular and subcortical white matter hypoattenuation, which is nonspecific but likely sequela of chronic  small vessel ischemic disease.Mild cerebral atrophy Vascular: Bilateral carotid calcifications. Skull: Normal. Negative for fracture or focal lesion. Sinuses/Orbits: No acute finding. Other: None. IMPRESSION: No acute intracranial abnormality. Sequela of chronic small vessel ischemic disease and cerebral atrophy. Electronically Signed   By: Maurine Simmering M.D.   On: 03/04/2021 14:24   CT Cervical Spine Wo Contrast  Result Date: 03/04/2021 CLINICAL DATA:  Neck trauma (Age >= 65y) EXAM: CT CERVICAL SPINE WITHOUT CONTRAST TECHNIQUE: Multidetector CT imaging of the cervical spine was performed without intravenous contrast. Multiplanar CT image reconstructions were also generated. COMPARISON:  None. FINDINGS: Alignment: Straightening of the normal cervical lordosis. Trace anterolisthesis at C4-C5. Skull base and vertebrae: There is no evidence of acute cervical spine fracture. Mild anterior height loss of C7 which is likely chronic. There is no aggressive osseous lesion. Soft tissues and spinal canal: No prevertebral fluid or swelling. No visible canal hematoma. Disc levels: Multilevel degenerative disc disease, severe at C5-C6 and moderate at C6-C7 and C7-T1. There is multilevel facet arthropathy bilaterally. Upper chest: Please see same-day separately dictated CT of the chest. Other: Atrophic thyroid. IMPRESSION: No acute cervical spine fracture. Mild anterior height loss of C7 which is favored to be chronic. Multilevel degenerative disc disease, worst from C5-T1. Electronically Signed   By: Maurine Simmering M.D.   On: 03/04/2021 14:28   DG Pelvis Portable  Result Date: 03/04/2021 CLINICAL DATA:  Altered mental status, questionable sepsis EXAM: PORTABLE PELVIS 1-2 VIEWS COMPARISON:  Portable exam 1317 hours without priors for comparison FINDINGS: Osseous demineralization. Hip and SI joint spaces preserved. No acute fracture, dislocation, or bone destruction. Scattered atherosclerotic calcifications. IMPRESSION: No  acute abnormalities. Electronically Signed   By: Lavonia Dana M.D.   On: 03/04/2021 13:52   CT CHEST ABDOMEN PELVIS W CONTRAST  Result Date: 03/04/2021 CLINICAL DATA:  F fall, unresponsive all moderate sponsored EXAM: CT CHEST, ABDOMEN, AND PELVIS WITH CONTRAST TECHNIQUE: Multidetector CT imaging of the chest, abdomen and pelvis was performed following the standard protocol during bolus administration of intravenous contrast. CONTRAST:  92mL OMNIPAQUE IOHEXOL 300 MG/ML  SOLN COMPARISON:  None. FINDINGS: CT CHEST FINDINGS Cardiovascular: Normal cardiac size.No pericardial disease.Coronary artery and mitral annular calcifications.Mild atherosclerotic calcifications of the thoracic aorta. Mediastinum/Nodes: No lymphadenopathy.Atrophic thyroid.Patulous esophagus with intraluminal gas and fluid. Lungs/Pleura: There is a 7 mm right apical ground-glass nodular opacity (series 4, image 24). There is no other focal airspace disease. There is atelectasis in the lingula and right middle lobe. Scattered lung scarring. No pleural effusion or pneumothorax. Musculoskeletal: Age-indeterminate central compression deformity of T2.Multilevel degenerative changes of the spine. Osteopenia. No suspicious lytic or blastic lesions. CT ABDOMEN PELVIS FINDINGS Hepatobiliary: No hepatic injury or perihepatic hematoma. Multiple calcified hepatic granulomas. The gallbladder is unremarkable. Pancreas: Unremarkable. No pancreatic ductal dilatation or surrounding inflammatory changes. Spleen: No splenic injury or perisplenic hematoma. Multiple splenic granulomas. Adrenals/Urinary Tract: No adrenal hemorrhage or renal injury identified. Bladder is unremarkable. No renal laceration. No hydronephrosis or nephrolithiasis. Small left renal cyst, subcentimeter. Bladder is mildly distended with wall  thickening likely related to underdistention. Stomach/Bowel: The stomach is within normal limits. There is no evidence of bowel obstruction. The appendix  is normal. No acute inflammatory process. No evidence of acute bowel injury. Sigmoid diverticulosis. Vascular/Lymphatic: Aorta iliac atherosclerotic calcifications. No AAA. No lymphadenopathy. Reproductive: Unremarkable. Other: No abdominal wall hernia or abnormality. No abdominopelvic ascites. Musculoskeletal: There is a nondisplaced right superior pubic ramus/pubic body fracture. Nondisplaced right sacral fracture in zone 1 (series 2, images 91-93) prior L1 vertebral plasty for an L1 burst fracture, as described on separately dictated lumbar spine CT. There is multilevel degenerative disc disease, worst at L2-L3 and L3-L4. IMPRESSION: Nondisplaced right superior pubic ramus/pubic body fracture. Nondisplaced right sacral fracture in zone 1. Age-indeterminate central compression deformity of T2, as described on separately dictated thoracic spine CT. No evidence of acute solid organ injury. Chronic findings as described above. 7 mm right apical ground-glass pulmonary nodule, which is likely infectious/inflammatory. Recommend a non-contrast Chest CT at 6-12 months to confirm persistence, then additional non-contrast Chest CTs every 2 years until 5 years. If nodule grows or develops solid component(s), consider resection. These guidelines do not apply to immunocompromised patients and patients with cancer. Follow up in patients with significant comorbidities as clinically warranted. Reference: Radiology. 2017; 284(1):228-43. Electronically Signed   By: Maurine Simmering M.D.   On: 03/04/2021 14:44   CT T-SPINE NO CHARGE  Result Date: 03/04/2021 CLINICAL DATA:  Fall EXAM: CT Thoracic and Lumbar spine without contrast TECHNIQUE: Multiplanar CT images of the thoracic and lumbar spine were reconstructed from contemporary CT of the Chest, Abdomen, and Pelvis CONTRAST:  No dissection COMPARISON:  None FINDINGS: CT THORACIC SPINE FINDINGS Alignment: Physiologic Vertebrae: There is an age-indeterminate central compression  deformity of T2 with approximately 30% height loss. No bony retropulsion. No other evidence of thoracic spine fracture. Diffuse osteopenia. No aggressive osseous lesion/suspicious lytic or blastic lesions. Paraspinal and other soft tissues: Reported separately. Disc levels: Multilevel degenerate endplate changes and disc disease, most prominent at T2-T3. C7-T1 degenerative disc disease and facet arthropathy. CT LUMBAR SPINE FINDINGS Segmentation: Normal Alignment: Trace anterolisthesis at L3-L4. Vertebrae: Osteopenia. Prior L1 burst fracture with postprocedural changes of vertebroplasty. There is 5 mm bony retropulsion of the superior endplate which is presumably chronic. No priors for comparison. No other evidence of fracture. Paraspinal and other soft tissues: Reported separately Disc levels: There is multilevel degenerative disc disease, most severe at L2-L3 and L3-L4. There is moderate multilevel facet arthropathy. There is multilevel disc bulging with likely very degrees of mild-to-moderate spinal canal and neural foraminal narrowing. IMPRESSION: THORACIC SPINE CT Age-indeterminate central compression deformity of T2 with approximately 30% height loss. No bony retropulsion. Multilevel degenerative disc disease, most prominent at T2-T3. LUMBAR SPINE CT Postprocedural changes of vertebroplasty for a prior L1 burst fracture. There is 5 mm bony retropulsion of the superior endplate which is presumably chronic, no priors for comparison. No other lumbar spine fracture. Multilevel degenerative disc disease and facet arthropathy with disc bulging, most severe at L2-L3 and L3-L4. Likely varying degrees of mild to moderate spinal canal and neural foraminal narrowing. Electronically Signed   By: Maurine Simmering M.D.   On: 03/04/2021 14:20   CT L-SPINE NO CHARGE  Result Date: 03/04/2021 CLINICAL DATA:  Fall EXAM: CT Thoracic and Lumbar spine without contrast TECHNIQUE: Multiplanar CT images of the thoracic and lumbar  spine were reconstructed from contemporary CT of the Chest, Abdomen, and Pelvis CONTRAST:  No dissection COMPARISON:  None FINDINGS: CT THORACIC SPINE FINDINGS  Alignment: Physiologic Vertebrae: There is an age-indeterminate central compression deformity of T2 with approximately 30% height loss. No bony retropulsion. No other evidence of thoracic spine fracture. Diffuse osteopenia. No aggressive osseous lesion/suspicious lytic or blastic lesions. Paraspinal and other soft tissues: Reported separately. Disc levels: Multilevel degenerate endplate changes and disc disease, most prominent at T2-T3. C7-T1 degenerative disc disease and facet arthropathy. CT LUMBAR SPINE FINDINGS Segmentation: Normal Alignment: Trace anterolisthesis at L3-L4. Vertebrae: Osteopenia. Prior L1 burst fracture with postprocedural changes of vertebroplasty. There is 5 mm bony retropulsion of the superior endplate which is presumably chronic. No priors for comparison. No other evidence of fracture. Paraspinal and other soft tissues: Reported separately Disc levels: There is multilevel degenerative disc disease, most severe at L2-L3 and L3-L4. There is moderate multilevel facet arthropathy. There is multilevel disc bulging with likely very degrees of mild-to-moderate spinal canal and neural foraminal narrowing. IMPRESSION: THORACIC SPINE CT Age-indeterminate central compression deformity of T2 with approximately 30% height loss. No bony retropulsion. Multilevel degenerative disc disease, most prominent at T2-T3. LUMBAR SPINE CT Postprocedural changes of vertebroplasty for a prior L1 burst fracture. There is 5 mm bony retropulsion of the superior endplate which is presumably chronic, no priors for comparison. No other lumbar spine fracture. Multilevel degenerative disc disease and facet arthropathy with disc bulging, most severe at L2-L3 and L3-L4. Likely varying degrees of mild to moderate spinal canal and neural foraminal narrowing. Electronically  Signed   By: Maurine Simmering M.D.   On: 03/04/2021 14:20   DG Chest Port 1 View  Result Date: 03/04/2021 CLINICAL DATA:  Altered mental status questionable sepsis EXAM: PORTABLE CHEST 1 VIEW COMPARISON:  Portable exam 1315 hours compared to 01/30/2021 FINDINGS: Enlargement of cardiac silhouette. Mediastinal contours and pulmonary vascularity normal. Atherosclerotic calcification aorta. Chronic bronchitic changes. Lungs clear. No pulmonary infiltrate, pleural effusion, or pneumothorax. Small calcified granulomata at lower LEFT lung noted. Diffuse osseous demineralization with prior vertebroplasty at question L1. IMPRESSION: Enlargement of cardiac silhouette and chronic bronchitic changes. No acute abnormalities. Aortic Atherosclerosis (ICD10-I70.0). Electronically Signed   By: Lavonia Dana M.D.   On: 03/04/2021 13:52     Positive ROS: All other systems have been reviewed and were otherwise negative with the exception of those mentioned in the HPI and as above.  Physical Exam: BP (!) 163/68   Pulse 70   Temp (!) 97.5 F (36.4 C) (Oral)   Resp 20   Ht 5\' 4"  (1.626 m)   Wt 63.5 kg   SpO2 99%   BMI 24.03 kg/m  General:  Alert, no acute distress, oriented x name only Psychiatric:  Patient is NOT competent for consent, non-agitated Cardiovascular:  No pedal edema, regular rate and rhythm Respiratory:  No wheezing, non-labored breathing GI:  Abdomen is soft and non-tender Skin:  No lesions in the area of chief complaint, no erythema Neurologic:  Sensation intact distally, CN grossly intact Lymphatic:  No axillary or cervical lymphadenopathy  Bilat LE: 5/5 DF/PF/EHL SILT s/s/t/sp/dp distr Foot wwp No significant pain with axial load or logroll.  Able to range both hips without significant pain. Mild pain about R sacral region with R hip RoM.   X-rays:  As above: normal radiographs with CT scan of the pelvis showing a minimally displaced right superior pubic ramus fracture and right sacral ala  fracture.  Assessment/Plan: 87 year old female with right minimally displaced superior pubic ramus fracture and right sacral ala fracture. 1.  No surgical intervention planned for these fractures.  Recommend weightbearing  as tolerated.  2.  PT/OT for mobilization.  3.  Pain control per primary team.  4.  Follow-up in 2 weeks as an outpatient with [facility] orthopedics.  We will plan to follow peripherally.  Please page with any further questions.    Leim Fabry   03/04/2021 3:52 PM

## 2021-03-04 NOTE — ED Notes (Signed)
Attempting to pull bloodwork off EMS IV. Flushes well but won't pull back enough blood.

## 2021-03-04 NOTE — ED Notes (Signed)
Notified EDP Z Smith via secure chat of lactic 2.6.

## 2021-03-04 NOTE — ED Notes (Addendum)
Phlebotomy to bedside but pt already going down hallway for CT. Phlebotomy states they'll collect blood once pt back to room. 2 warm blankets placed over pt.

## 2021-03-04 NOTE — ED Notes (Signed)
CT called to notify this RN that R hand IV came out due to diaphoresis at dressing. Stated CT staff placed new IV.

## 2021-03-04 NOTE — ED Notes (Signed)
Provider notified pt is covid positive 

## 2021-03-04 NOTE — ED Provider Notes (Signed)
Procedures     ----------------------------------------- 4:47 PM on 03/04/2021 -----------------------------------------  Ortho Dr. Posey Pronto advises pelvic fracture injuries are nonoperative, patient can weight-bear as tolerated.  We will need PT evaluation.  Case discussed with hospitalist for further evaluation of syncope.  RN reports doing catheterization for urine and having no output.  Patient currently receiving an IV fluid bolus for hydration.    Katelyn Mew, MD 03/04/21 [phone]

## 2021-03-04 NOTE — ED Notes (Addendum)
Pt in from nursing facility due to being found facing down on floor in shower. Pt confused. R hand IV placed by EMS and 500cc fluid bolus started by EMS. Pt diaphoretic. No obvious bleeding or hematoma's noted. Pt denies any pain. Keeps stating she feels cold. Pt from [place].

## 2021-03-04 NOTE — ED Notes (Signed)
Phlebotomy confirmed drew cultures and VBG.

## 2021-03-04 NOTE — ED Notes (Signed)
Having a hard time capturing EKG as pt extremely diaphoretic and confused.

## 2021-03-04 NOTE — ED Notes (Signed)
Called phlebotomy to assist with collecting bloodwork.

## 2021-03-04 NOTE — ED Triage Notes (Signed)
Pt in from nursing facility via EMS due to unresponsive episode while taking a shower. Pt found laying face down on floor. Diaphoretic and confused. BP 81/42 with EMS; 500cc NS bolus started by EMS at R hand IV; BG 187 with EMS; type 1 diabetic per facility.

## 2021-03-04 NOTE — ED Notes (Signed)
Attempted I&O cath. 2nd RN will try soon with this RN assisting.

## 2021-03-04 NOTE — ED Notes (Signed)
Peri care provided. Pt attempting to use bedpan so she doesn't have to have I&O cath completed; pt understands if unable she will need I&O cath. Pt calm and cooperative. Pt has R hip tenderness.

## 2021-03-04 NOTE — ED Notes (Signed)
Pt denies nausea and HA. Reports that she is cold. Keeps asking "what's wrong with me". Pt doesn't remember falling today. Pt A&Ox1; knows self only.

## 2021-03-04 NOTE — ED Notes (Addendum)
2nd RN attempted I&O cath for urine. Notified EDP unable to collect sample currently. Purewick applied.

## 2021-03-04 NOTE — ED Notes (Signed)
Attempting to capture EKG but pt not yet registered. Attempting to collect blood work; pt hard stick & EMS IV not pulling back enough blood.

## 2021-03-04 NOTE — ED Notes (Signed)
Pt given dinner tray; assisted to sit up; assisted to set up tray.

## 2021-03-04 NOTE — ED Notes (Addendum)
Pt tender at lower abdomen as this RN attempting to prompt bladder for urination. EDP notified of R hip and lower abd tenderness. Consistent with CT findings.

## 2021-03-04 NOTE — H&P (Signed)
History and Physical    Katelyn Arroyo UJW:[id number] DOB: 1928-06-08 DOA: 03/04/2021  PCP: [facility]  Patient coming from: ALF   Chief Complaint: syncopal episode   HPI: 87 y/o F w/ PMH of dementia, DM, hypothyroidism, depression who presented after a syncopal episode. Pt is a very poor historian and only oriented to person. Pt does not have any memory of the syncopal episode. The syncopal episode was unwitnessed. As pt's daughter, the pt did not c/o anything earlier in the day and pt's daughter had talked to pt earlier in the day & prior to the syncopal episode. Pt c/o lower back pain that is dull constant w/o radiation. Nothing makes the pain better or worse. The severity is currently 8/10. Pt denies any fever, chills, sweating, cough, chest pain, shortness of breath, nausea, vomiting, abd pain, dysuria, urinary urgency, urinary frequency, diarrhea or constipation.   Review of Systems: As per HPI otherwise 10 point review of systems negative.    Past Medical History:  Diagnosis Date   Diabetes mellitus without complication ([place])     History reviewed. No pertinent surgical history.   reports that she has quit smoking. Her smoking use included cigarettes. She has never used smokeless tobacco. She reports that she does not currently use alcohol. She reports that she does not use drugs.  Allergies  Allergen Reactions   Ceftriaxone     Family History  Problem Relation Age of Onset   Heart disease Father    Heart disease Other     Prior to Admission medications   Medication Sig Start Date End Date Taking? Authorizing Provider  acetaminophen (TYLENOL) 325 MG tablet Take 650 mg by mouth every 6 (six) hours as needed for mild pain.    [provider]  donepezil (ARICEPT) 10 MG tablet Take 10 mg by mouth at bedtime.    [provider]  insulin aspart (NOVOLOG) 100 UNIT/ML injection Inject 4-10 Units into the skin 3 (three) times daily with meals. Inject  10 units subcutaneously in the morning, 7 units with lunch and 4 units in the evening (give with a snack/meal)    [provider]  insulin glargine (LANTUS) 100 UNIT/ML injection Inject 10 Units into the skin daily.    [provider]  levothyroxine (SYNTHROID) 125 MCG tablet Take 125 mcg by mouth daily before breakfast.    [provider]  Melatonin 10 MG TABS Take 10 mg by mouth at bedtime.    [provider]  memantine (NAMENDA) 10 MG tablet Take 10 mg by mouth 2 (two) times daily.    [provider]  senna-docusate (SENOKOT-S) 8.6-50 MG tablet Take 1 tablet by mouth every 12 (twelve) hours.    [provider]  sertraline (ZOLOFT) 25 MG tablet Take 25 mg by mouth at bedtime.    [provider]    Physical Exam: Vitals:   03/04/21 1545 03/04/21 1600 03/04/21 1615 03/04/21 1630  BP:  (!) 156/85    Pulse: 72 77  75  Resp: (!) 21 (!) 24 (!) 25   Temp:      TempSrc:      SpO2: 100% 100%  99%  Weight:      Height:        Constitutional: NAD, calm but uncomfortable Vitals:   03/04/21 1545 03/04/21 1600 03/04/21 1615 03/04/21 1630  BP:  (!) 156/85    Pulse: 72 77  75  Resp: (!) 21 (!) 24 (!) 25   Temp:  TempSrc:      SpO2: 100% 100%  99%  Weight:      Height:       Eyes: PERRL, lids and conjunctivae normal ENMT: Mucous membranes are moist.  Neck: normal, supple Respiratory: clear to auscultation bilaterally, no wheezing, no crackles. No accessory muscle use.  Cardiovascular: S1/S2+. No rubs / gallops. Abdomen: soft, no tenderness, non-distended. Bowel sounds positive.  Musculoskeletal: no clubbing / cyanosis..  Skin: no rashes, lesions, ulcers. Neurologic: CN 2-12 grossly intact. Decreased strength of b/l LE  Psychiatric: Poor judgment and insight. Alert and oriented to self only. Flat mood and affect     Labs on Admission: I have personally reviewed following labs and imaging studies  CBC: Recent Labs   Lab 03/04/21 1414  WBC 7.1  NEUTROABS 5.0  HGB 11.2*  HCT 33.6*  MCV 88.0  PLT 401   Basic Metabolic Panel: Recent Labs  Lab 03/04/21 1414  NA 137  K 4.5  CL 101  CO2 24  GLUCOSE 167*  BUN 37*  CREATININE 2.30*  CALCIUM 8.4*  MG 2.0   GFR: Estimated Creatinine Clearance: 14.3 mL/min (A) (by C-G formula based on SCr of 2.3 mg/dL (H)). Liver Function Tests: Recent Labs  Lab 03/04/21 1414  AST 17  ALT 13  ALKPHOS 80  BILITOT 0.9  PROT 5.9*  ALBUMIN 3.3*   No results for input(s): LIPASE, AMYLASE in the last 168 hours. No results for input(s): AMMONIA in the last 168 hours. Coagulation Profile: Recent Labs  Lab 03/04/21 1414  INR 0.9   Cardiac Enzymes: No results for input(s): CKTOTAL, CKMB, CKMBINDEX, TROPONINI in the last 168 hours. BNP (last 3 results) No results for input(s): PROBNP in the last 8760 hours. HbA1C: No results for input(s): HGBA1C in the last 72 hours. CBG: Recent Labs  Lab 03/04/21 1319 03/04/21 1633  GLUCAP 166* 161*   Lipid Profile: No results for input(s): CHOL, HDL, LDLCALC, TRIG, CHOLHDL, LDLDIRECT in the last 72 hours. Thyroid Function Tests: No results for input(s): TSH, T4TOTAL, FREET4, T3FREE, THYROIDAB in the last 72 hours. Anemia Panel: No results for input(s): VITAMINB12, FOLATE, FERRITIN, TIBC, IRON, RETICCTPCT in the last 72 hours. Urine analysis:    Component Value Date/Time   COLORURINE STRAW (A) 01/30/2021 1332   APPEARANCEUR CLEAR (A) 01/30/2021 1332   LABSPEC 1.017 01/30/2021 1332   PHURINE 5.0 01/30/2021 1332   GLUCOSEU >=500 (A) 01/30/2021 1332   HGBUR SMALL (A) 01/30/2021 1332   BILIRUBINUR NEGATIVE 01/30/2021 1332   KETONESUR 20 (A) 01/30/2021 1332   PROTEINUR 100 (A) 01/30/2021 1332   NITRITE NEGATIVE 01/30/2021 1332   LEUKOCYTESUR NEGATIVE 01/30/2021 1332    Radiological Exams on Admission: CT HEAD WO CONTRAST (5MM)  Result Date: 03/04/2021 CLINICAL DATA:  Head trauma, minor (Age >= 65y) EXAM:  CT HEAD WITHOUT CONTRAST TECHNIQUE: Contiguous axial images were obtained from the base of the skull through the vertex without intravenous contrast. COMPARISON:  None. FINDINGS: Brain: No evidence of acute intracranial hemorrhage or extra-axial collection.No evidence of mass lesion/concern mass effect.The ventricles appear mildly enlarged but proportional to degree of cerebral atrophy.Confluent periventricular and subcortical white matter hypoattenuation, which is nonspecific but likely sequela of chronic small vessel ischemic disease.Mild cerebral atrophy Vascular: Bilateral carotid calcifications. Skull: Normal. Negative for fracture or focal lesion. Sinuses/Orbits: No acute finding. Other: None. IMPRESSION: No acute intracranial abnormality. Sequela of chronic small vessel ischemic disease and cerebral atrophy. Electronically Signed   By: Maurine Simmering M.D.   On:  03/04/2021 14:24   CT Cervical Spine Wo Contrast  Result Date: 03/04/2021 CLINICAL DATA:  Neck trauma (Age >= 65y) EXAM: CT CERVICAL SPINE WITHOUT CONTRAST TECHNIQUE: Multidetector CT imaging of the cervical spine was performed without intravenous contrast. Multiplanar CT image reconstructions were also generated. COMPARISON:  None. FINDINGS: Alignment: Straightening of the normal cervical lordosis. Trace anterolisthesis at C4-C5. Skull base and vertebrae: There is no evidence of acute cervical spine fracture. Mild anterior height loss of C7 which is likely chronic. There is no aggressive osseous lesion. Soft tissues and spinal canal: No prevertebral fluid or swelling. No visible canal hematoma. Disc levels: Multilevel degenerative disc disease, severe at C5-C6 and moderate at C6-C7 and C7-T1. There is multilevel facet arthropathy bilaterally. Upper chest: Please see same-day separately dictated CT of the chest. Other: Atrophic thyroid. IMPRESSION: No acute cervical spine fracture. Mild anterior height loss of C7 which is favored to be chronic.  Multilevel degenerative disc disease, worst from C5-T1. Electronically Signed   By: Maurine Simmering M.D.   On: 03/04/2021 14:28   DG Pelvis Portable  Result Date: 03/04/2021 CLINICAL DATA:  Altered mental status, questionable sepsis EXAM: PORTABLE PELVIS 1-2 VIEWS COMPARISON:  Portable exam 1317 hours without priors for comparison FINDINGS: Osseous demineralization. Hip and SI joint spaces preserved. No acute fracture, dislocation, or bone destruction. Scattered atherosclerotic calcifications. IMPRESSION: No acute abnormalities. Electronically Signed   By: Lavonia Dana M.D.   On: 03/04/2021 13:52   CT CHEST ABDOMEN PELVIS W CONTRAST  Result Date: 03/04/2021 CLINICAL DATA:  F fall, unresponsive all moderate sponsored EXAM: CT CHEST, ABDOMEN, AND PELVIS WITH CONTRAST TECHNIQUE: Multidetector CT imaging of the chest, abdomen and pelvis was performed following the standard protocol during bolus administration of intravenous contrast. CONTRAST:  61mL OMNIPAQUE IOHEXOL 300 MG/ML  SOLN COMPARISON:  None. FINDINGS: CT CHEST FINDINGS Cardiovascular: Normal cardiac size.No pericardial disease.Coronary artery and mitral annular calcifications.Mild atherosclerotic calcifications of the thoracic aorta. Mediastinum/Nodes: No lymphadenopathy.Atrophic thyroid.Patulous esophagus with intraluminal gas and fluid. Lungs/Pleura: There is a 7 mm right apical ground-glass nodular opacity (series 4, image 24). There is no other focal airspace disease. There is atelectasis in the lingula and right middle lobe. Scattered lung scarring. No pleural effusion or pneumothorax. Musculoskeletal: Age-indeterminate central compression deformity of T2.Multilevel degenerative changes of the spine. Osteopenia. No suspicious lytic or blastic lesions. CT ABDOMEN PELVIS FINDINGS Hepatobiliary: No hepatic injury or perihepatic hematoma. Multiple calcified hepatic granulomas. The gallbladder is unremarkable. Pancreas: Unremarkable. No pancreatic ductal  dilatation or surrounding inflammatory changes. Spleen: No splenic injury or perisplenic hematoma. Multiple splenic granulomas. Adrenals/Urinary Tract: No adrenal hemorrhage or renal injury identified. Bladder is unremarkable. No renal laceration. No hydronephrosis or nephrolithiasis. Small left renal cyst, subcentimeter. Bladder is mildly distended with wall thickening likely related to underdistention. Stomach/Bowel: The stomach is within normal limits. There is no evidence of bowel obstruction. The appendix is normal. No acute inflammatory process. No evidence of acute bowel injury. Sigmoid diverticulosis. Vascular/Lymphatic: Aorta iliac atherosclerotic calcifications. No AAA. No lymphadenopathy. Reproductive: Unremarkable. Other: No abdominal wall hernia or abnormality. No abdominopelvic ascites. Musculoskeletal: There is a nondisplaced right superior pubic ramus/pubic body fracture. Nondisplaced right sacral fracture in zone 1 (series 2, images 91-93) prior L1 vertebral plasty for an L1 burst fracture, as described on separately dictated lumbar spine CT. There is multilevel degenerative disc disease, worst at L2-L3 and L3-L4. IMPRESSION: Nondisplaced right superior pubic ramus/pubic body fracture. Nondisplaced right sacral fracture in zone 1. Age-indeterminate central compression deformity of T2, as  described on separately dictated thoracic spine CT. No evidence of acute solid organ injury. Chronic findings as described above. 7 mm right apical ground-glass pulmonary nodule, which is likely infectious/inflammatory. Recommend a non-contrast Chest CT at 6-12 months to confirm persistence, then additional non-contrast Chest CTs every 2 years until 5 years. If nodule grows or develops solid component(s), consider resection. These guidelines do not apply to immunocompromised patients and patients with cancer. Follow up in patients with significant comorbidities as clinically warranted. Reference: Radiology. 2017;  284(1):228-43. Electronically Signed   By: Maurine Simmering M.D.   On: 03/04/2021 14:44   CT T-SPINE NO CHARGE  Result Date: 03/04/2021 CLINICAL DATA:  Fall EXAM: CT Thoracic and Lumbar spine without contrast TECHNIQUE: Multiplanar CT images of the thoracic and lumbar spine were reconstructed from contemporary CT of the Chest, Abdomen, and Pelvis CONTRAST:  No dissection COMPARISON:  None FINDINGS: CT THORACIC SPINE FINDINGS Alignment: Physiologic Vertebrae: There is an age-indeterminate central compression deformity of T2 with approximately 30% height loss. No bony retropulsion. No other evidence of thoracic spine fracture. Diffuse osteopenia. No aggressive osseous lesion/suspicious lytic or blastic lesions. Paraspinal and other soft tissues: Reported separately. Disc levels: Multilevel degenerate endplate changes and disc disease, most prominent at T2-T3. C7-T1 degenerative disc disease and facet arthropathy. CT LUMBAR SPINE FINDINGS Segmentation: Normal Alignment: Trace anterolisthesis at L3-L4. Vertebrae: Osteopenia. Prior L1 burst fracture with postprocedural changes of vertebroplasty. There is 5 mm bony retropulsion of the superior endplate which is presumably chronic. No priors for comparison. No other evidence of fracture. Paraspinal and other soft tissues: Reported separately Disc levels: There is multilevel degenerative disc disease, most severe at L2-L3 and L3-L4. There is moderate multilevel facet arthropathy. There is multilevel disc bulging with likely very degrees of mild-to-moderate spinal canal and neural foraminal narrowing. IMPRESSION: THORACIC SPINE CT Age-indeterminate central compression deformity of T2 with approximately 30% height loss. No bony retropulsion. Multilevel degenerative disc disease, most prominent at T2-T3. LUMBAR SPINE CT Postprocedural changes of vertebroplasty for a prior L1 burst fracture. There is 5 mm bony retropulsion of the superior endplate which is presumably chronic,  no priors for comparison. No other lumbar spine fracture. Multilevel degenerative disc disease and facet arthropathy with disc bulging, most severe at L2-L3 and L3-L4. Likely varying degrees of mild to moderate spinal canal and neural foraminal narrowing. Electronically Signed   By: Maurine Simmering M.D.   On: 03/04/2021 14:20   CT L-SPINE NO CHARGE  Result Date: 03/04/2021 CLINICAL DATA:  Fall EXAM: CT Thoracic and Lumbar spine without contrast TECHNIQUE: Multiplanar CT images of the thoracic and lumbar spine were reconstructed from contemporary CT of the Chest, Abdomen, and Pelvis CONTRAST:  No dissection COMPARISON:  None FINDINGS: CT THORACIC SPINE FINDINGS Alignment: Physiologic Vertebrae: There is an age-indeterminate central compression deformity of T2 with approximately 30% height loss. No bony retropulsion. No other evidence of thoracic spine fracture. Diffuse osteopenia. No aggressive osseous lesion/suspicious lytic or blastic lesions. Paraspinal and other soft tissues: Reported separately. Disc levels: Multilevel degenerate endplate changes and disc disease, most prominent at T2-T3. C7-T1 degenerative disc disease and facet arthropathy. CT LUMBAR SPINE FINDINGS Segmentation: Normal Alignment: Trace anterolisthesis at L3-L4. Vertebrae: Osteopenia. Prior L1 burst fracture with postprocedural changes of vertebroplasty. There is 5 mm bony retropulsion of the superior endplate which is presumably chronic. No priors for comparison. No other evidence of fracture. Paraspinal and other soft tissues: Reported separately Disc levels: There is multilevel degenerative disc disease, most severe at L2-L3 and  L3-L4. There is moderate multilevel facet arthropathy. There is multilevel disc bulging with likely very degrees of mild-to-moderate spinal canal and neural foraminal narrowing. IMPRESSION: THORACIC SPINE CT Age-indeterminate central compression deformity of T2 with approximately 30% height loss. No bony  retropulsion. Multilevel degenerative disc disease, most prominent at T2-T3. LUMBAR SPINE CT Postprocedural changes of vertebroplasty for a prior L1 burst fracture. There is 5 mm bony retropulsion of the superior endplate which is presumably chronic, no priors for comparison. No other lumbar spine fracture. Multilevel degenerative disc disease and facet arthropathy with disc bulging, most severe at L2-L3 and L3-L4. Likely varying degrees of mild to moderate spinal canal and neural foraminal narrowing. Electronically Signed   By: Maurine Simmering M.D.   On: 03/04/2021 14:20   DG Chest Port 1 View  Result Date: 03/04/2021 CLINICAL DATA:  Altered mental status questionable sepsis EXAM: PORTABLE CHEST 1 VIEW COMPARISON:  Portable exam 1315 hours compared to 01/30/2021 FINDINGS: Enlargement of cardiac silhouette. Mediastinal contours and pulmonary vascularity normal. Atherosclerotic calcification aorta. Chronic bronchitic changes. Lungs clear. No pulmonary infiltrate, pleural effusion, or pneumothorax. Small calcified granulomata at lower LEFT lung noted. Diffuse osseous demineralization with prior vertebroplasty at question L1. IMPRESSION: Enlargement of cardiac silhouette and chronic bronchitic changes. No acute abnormalities. Aortic Atherosclerosis (ICD10-I70.0). Electronically Signed   By: Lavonia Dana M.D.   On: 03/04/2021 13:52    EKG: Independently reviewed.   Assessment/Plan Active Problems:   * No active hospital problems. *  Syncopal episode: etiology unclear, ddx hypoglycemia vs vasovagal vs arrhythmia vs neurogenic. CT head shows no acute intracranial findings  CT neck shows no acute c-spine fracture. CT abd/plevis shows nondisplaced right superior pubic ramus/pubic body fracture, nondisplaced right sacral fracture. Urine cx, echo & carotid US ordered. Continue on tele   Pelvic fractures: nondisplaced right superior pubic ramus/pubic body fracture, nondisplaced right sacral fracture. No surgery  needed as per ortho. Ortho recs apprec. Tylenol, percocet, & morphine prn for pain. PT/OT consulted.   Pulmonary nodule:  45mm incidental finding on CT scan. Needs repeat CT in 6-12 month as per rads  Likely CKD: baseline Cr/GFR is unknown, currently stage IV. Continue on IVFs   Hypothyroidism: continue on home dose of levothyroxine  DM: type I vs type II. Pt says type I & pt's daughter says type II. Pt is poor historian & has dementia. Poorly controlled, HbA1c 9.1 in 01/2021. Started on SSI w/ accuchecks  Dementia: continue on home dose of namenda, aricept   Depression: severity unknown. Continue on home dose of sertraline     DVT prophylaxis: lovenox Code Status: DNR as per pt's daughter, Trixie  Family Communication: discussed pt's care w/ pt's daughter, Trixie and answered questions. Pt's daughter is moving back to [place] this week and plans on moving the pt to [place] as well and to another ALF there  Disposition Plan: depends on OT/PT recs  Consults called: ortho surg, Dr. Leim Fabry, as per ER doc  Admission status: observation    Wyvonnia Dusky MD [facility]   If 7PM-7AM, please contact night-coverage   03/04/2021, 4:48 PM

## 2021-03-04 NOTE — ED Notes (Signed)
Phlebotomy back to bedside to help with collecting blood cultures.

## 2021-03-05 ENCOUNTER — Observation Stay (HOSPITAL_COMMUNITY)
Admit: 2021-03-05 | Discharge: 2021-03-05 | Disposition: A | Discharge status: Home / Self Care | Payer: Medicare Other | Attending: Internal Medicine | Admitting: Internal Medicine

## 2021-03-05 DIAGNOSIS — S32119A Unspecified Zone I fracture of sacrum, initial encounter for closed fracture: Secondary | ICD-10-CM | POA: Diagnosis present

## 2021-03-05 DIAGNOSIS — E1165 Type 2 diabetes mellitus with hyperglycemia: Secondary | ICD-10-CM | POA: Diagnosis present

## 2021-03-05 DIAGNOSIS — S3210XA Unspecified fracture of sacrum, initial encounter for closed fracture: Secondary | ICD-10-CM

## 2021-03-05 DIAGNOSIS — N184 Chronic kidney disease, stage 4 (severe): Secondary | ICD-10-CM | POA: Diagnosis present

## 2021-03-05 DIAGNOSIS — F419 Anxiety disorder, unspecified: Secondary | ICD-10-CM | POA: Diagnosis present

## 2021-03-05 DIAGNOSIS — Z66 Do not resuscitate: Secondary | ICD-10-CM | POA: Diagnosis present

## 2021-03-05 DIAGNOSIS — E119 Type 2 diabetes mellitus without complications: Secondary | ICD-10-CM

## 2021-03-05 DIAGNOSIS — Z79890 Hormone replacement therapy (postmenopausal): Secondary | ICD-10-CM | POA: Diagnosis not present

## 2021-03-05 DIAGNOSIS — R55 Syncope and collapse: Secondary | ICD-10-CM | POA: Diagnosis present

## 2021-03-05 DIAGNOSIS — M4854XA Collapsed vertebra, not elsewhere classified, thoracic region, initial encounter for fracture: Secondary | ICD-10-CM | POA: Diagnosis present

## 2021-03-05 DIAGNOSIS — S32511D Fracture of superior rim of right pubis, subsequent encounter for fracture with routine healing: Secondary | ICD-10-CM | POA: Diagnosis not present

## 2021-03-05 DIAGNOSIS — I959 Hypotension, unspecified: Secondary | ICD-10-CM | POA: Diagnosis present

## 2021-03-05 DIAGNOSIS — S32511A Fracture of superior rim of right pubis, initial encounter for closed fracture: Secondary | ICD-10-CM | POA: Diagnosis not present

## 2021-03-05 DIAGNOSIS — E039 Hypothyroidism, unspecified: Secondary | ICD-10-CM | POA: Diagnosis present

## 2021-03-05 DIAGNOSIS — I129 Hypertensive chronic kidney disease with stage 1 through stage 4 chronic kidney disease, or unspecified chronic kidney disease: Secondary | ICD-10-CM | POA: Diagnosis present

## 2021-03-05 DIAGNOSIS — W19XXXA Unspecified fall, initial encounter: Secondary | ICD-10-CM | POA: Diagnosis present

## 2021-03-05 DIAGNOSIS — U071 COVID-19: Secondary | ICD-10-CM

## 2021-03-05 DIAGNOSIS — S3210XD Unspecified fracture of sacrum, subsequent encounter for fracture with routine healing: Secondary | ICD-10-CM | POA: Diagnosis not present

## 2021-03-05 DIAGNOSIS — Z79899 Other long term (current) drug therapy: Secondary | ICD-10-CM | POA: Diagnosis not present

## 2021-03-05 DIAGNOSIS — S32591A Other specified fracture of right pubis, initial encounter for closed fracture: Secondary | ICD-10-CM | POA: Diagnosis present

## 2021-03-05 DIAGNOSIS — F0390 Unspecified dementia without behavioral disturbance: Secondary | ICD-10-CM | POA: Diagnosis not present

## 2021-03-05 DIAGNOSIS — R911 Solitary pulmonary nodule: Secondary | ICD-10-CM | POA: Diagnosis present

## 2021-03-05 DIAGNOSIS — S32519A Fracture of superior rim of unspecified pubis, initial encounter for closed fracture: Secondary | ICD-10-CM

## 2021-03-05 DIAGNOSIS — J449 Chronic obstructive pulmonary disease, unspecified: Secondary | ICD-10-CM | POA: Diagnosis present

## 2021-03-05 DIAGNOSIS — Z794 Long term (current) use of insulin: Secondary | ICD-10-CM | POA: Diagnosis not present

## 2021-03-05 DIAGNOSIS — E872 Acidosis, unspecified: Secondary | ICD-10-CM | POA: Diagnosis present

## 2021-03-05 DIAGNOSIS — E785 Hyperlipidemia, unspecified: Secondary | ICD-10-CM | POA: Diagnosis present

## 2021-03-05 DIAGNOSIS — Z87891 Personal history of nicotine dependence: Secondary | ICD-10-CM | POA: Diagnosis not present

## 2021-03-05 DIAGNOSIS — F0393 Unspecified dementia, unspecified severity, with mood disturbance: Secondary | ICD-10-CM | POA: Diagnosis present

## 2021-03-05 DIAGNOSIS — E875 Hyperkalemia: Secondary | ICD-10-CM | POA: Diagnosis present

## 2021-03-05 DIAGNOSIS — S22020A Wedge compression fracture of second thoracic vertebra, initial encounter for closed fracture: Secondary | ICD-10-CM

## 2021-03-05 LAB — CBC
HCT: 33.4 % — ABNORMAL LOW (ref 36.0–46.0)
Hemoglobin: 10.7 g/dL — ABNORMAL LOW (ref 12.0–15.0)
MCH: 29.1 pg (ref 26.0–34.0)
MCHC: 32 g/dL (ref 30.0–36.0)
MCV: 90.8 fL (ref 80.0–100.0)
Platelets: 213 10*3/uL (ref 150–400)
RBC: 3.68 MIL/uL — ABNORMAL LOW (ref 3.87–5.11)
RDW: 13.4 % (ref 11.5–15.5)
WBC: 7.9 10*3/uL (ref 4.0–10.5)
nRBC: 0 % (ref 0.0–0.2)

## 2021-03-05 LAB — URINE CULTURE: Culture: <10000 — AB

## 2021-03-05 LAB — BASIC METABOLIC PANEL
Anion gap: 13 (ref 5–15)
BUN: 39 mg/dL — ABNORMAL HIGH (ref 8–23)
CO2: 22 mmol/L (ref 22–32)
Calcium: 8.3 mg/dL — ABNORMAL LOW (ref 8.9–10.3)
Chloride: 97 mmol/L — ABNORMAL LOW (ref 98–111)
Creatinine, Ser: 2.4 mg/dL — ABNORMAL HIGH (ref 0.44–1.00)
GFR, Estimated: 19 mL/min — ABNORMAL LOW (ref >60)
Glucose, Bld: 363 mg/dL — ABNORMAL HIGH (ref 70–99)
Potassium: 5.2 mmol/L — ABNORMAL HIGH (ref 3.5–5.1)
Sodium: 132 mmol/L — ABNORMAL LOW (ref 135–145)

## 2021-03-05 LAB — ECHOCARDIOGRAM COMPLETE
AR max vel: 3.04 cm2
AV Area VTI: 3.61 cm2
AV Area mean vel: 3.68 cm2
AV Mean grad: 4 mmHg
AV Peak grad: 8.8 mmHg
Ao pk vel: 1.48 m/s
Area-P 1/2: 2.99 cm2
Height: 64 in
MV VTI: 2.63 cm2
S' Lateral: 2.59 cm
Weight: 2240 oz

## 2021-03-05 LAB — GLUCOSE, CAPILLARY
Glucose-Capillary: 167 mg/dL — ABNORMAL HIGH (ref 70–99)
Glucose-Capillary: 177 mg/dL — ABNORMAL HIGH (ref 70–99)
Glucose-Capillary: 200 mg/dL — ABNORMAL HIGH (ref 70–99)
Glucose-Capillary: 235 mg/dL — ABNORMAL HIGH (ref 70–99)

## 2021-03-05 LAB — CBG MONITORING, ED
Glucose-Capillary: 354 mg/dL — ABNORMAL HIGH (ref 70–99)
Glucose-Capillary: 210 mg/dL — ABNORMAL HIGH (ref 70–99)

## 2021-03-05 MED ORDER — SODIUM ZIRCONIUM CYCLOSILICATE 5 G PO PACK
5.0000 g | PACK | Freq: Once | ORAL | Status: AC
  Administered 2021-03-05: 5 g via ORAL
  Filled 2021-03-05: qty 1

## 2021-03-05 MED ORDER — INSULIN GLARGINE-YFGN 100 UNIT/ML ~~LOC~~ SOLN
10.0000 [IU] | Freq: Every day | SUBCUTANEOUS | Status: DC
  Administered 2021-03-05 – 2021-03-09 (×5): 10 [IU] via SUBCUTANEOUS
  Filled 2021-03-05 (×5): qty 0.1

## 2021-03-05 NOTE — Progress Notes (Signed)
OT Cancellation Note  Patient Details Name: Katelyn Arroyo MRN: [id number] DOB: 01-11-1929   Cancelled Treatment:    Reason Eval/Treat Not Completed: Medical issues which prohibited therapy. Consult received, chart reviewed. Pt noted with elevated K+ 5.2 and blood glucose >350, falling outside guidelines for therapy participation. Will hold this morning and re-attempt OT evaluation at later date/time as medically appropriate.   Ardeth Perfect., MPH, MS, OTR/L ascom [phone] 03/05/21, 9:13 AM

## 2021-03-05 NOTE — Progress Notes (Signed)
Inpatient Diabetes Program Recommendations  AACE/ADA: New Consensus Statement on Inpatient Glycemic Control   Target Ranges:  Prepandial:   less than 140 mg/dL      Peak postprandial:   less than 180 mg/dL (1-2 hours)      Critically ill patients:  140 - 180 mg/dL   Results for Katelyn Arroyo, Katelyn Arroyo (MRN [id number]) as of 03/05/2021 10:36  Ref. Range 03/04/2021 13:19 03/04/2021 16:33 03/04/2021 18:10 03/05/2021 07:34  Glucose-Capillary Latest Ref Range: 70 - 99 mg/dL 166 (H) 161 (H) 144 (H) 354 (H)   Results for Katelyn Arroyo, Katelyn Arroyo (MRN [id number]) as of 03/05/2021 10:36  Ref. Range 03/04/2021 17:40  SARS Coronavirus 2 by RT PCR Latest Ref Range: NEGATIVE  POSITIVE (A)   Review of Glycemic Control  Diabetes history: DM Outpatient Diabetes medications: Lantus 10 units daily, Novolog 4-10 units TID with meals Current orders for Inpatient glycemic control: Novolog 0-9 units TID with meals   Inpatient Diabetes Program Recommendations:    Insulin: Fasting glucose 354 mg/dl today. Please consider ordering Semglee 10 units daily to start now and adding Novolog 0-5 units QHS for bedtime correction.   Thanks, Barnie Alderman, RN, MSN, CDE Diabetes Coordinator Inpatient Diabetes Program [phone] (Team Pager from 8am to 5pm)

## 2021-03-05 NOTE — Progress Notes (Signed)
PROGRESS NOTE    Mitchell Epling   CBS:[id number]  DOB: 04/26/1929  PCP: [facility]    DOA: 03/04/2021 LOS: 0    Brief Narrative / Hospital Course to Date:   87 y/o F w/ PMH of dementia, insulin-dependent type 2 diabetes with admission for DKA earlier this year, hypothyroidism, depression who presented to the ED from SNF after a syncopal episode.  She was reportedly found unresponsive in her shower facedown by staff.  Patient is poor historian due to dementia and limited history was provided by SNF staff per EMS.  She was reportedly hypotensive with EMS and given half liter bolus IV fluids.  On admission was complaining of abdominal low back pain without radiation.    Patient was found to be positive for COVID-19 but denying respiratory symptoms or feeling sick.    Imaging studies revealed minimally displaced superior pubic ramus fracture and right sacral ala fracture.  Orthopedic surgery was consulted.    Assessment & Plan   Principal Problem:   Syncope Active Problems:   Essential hypertension   Dementia (HCC)   COPD (chronic obstructive pulmonary disease) (HCC)   Hyperlipidemia   Closed fracture of superior pubic ramus (HCC)   Closed sacral fracture (HCC)   Compression fracture of T2 vertebra (HCC)   COVID-19 virus infection   Insulin dependent type 2 diabetes mellitus ([place])   Syncopal episode -etiology unclear given very limited history.  Differential includes hypoglycemia, vasovagal syncope, arrhythmia versus neurogenic.   Head CT without acute intracranial findings. Neck CT without acute C-spine fracture. Pelvic and sacral fractures as below. Evaluation: Carotid ultrasound negative. Echo with EF greater than 84%, grade 1 diastolic dysfunction, mild LVH --Monitor on telemetry --Fall precautions --Orthostatic vitals if patient able to tolerate weightbearing with fractures --IV hydration  Hyperkalemia -mild, K5.2 this morning --5 g Lokelma  once --Monitor BMP  Right superior right ramus fracture Right sacral ala fracture -likely sustained a fall during syncopal episode. --Appreciate orthopedic surgery input --These are nonoperative --Conservative management with pain control --PT and OT evaluations --Weightbearing as tolerated --Follow-up with Ortho at [facility] in 2 weeks  Insulin-dependent type 2 diabetes -add 10 units basal insulin daily.  Sliding scale NovoLog.  Adjust insulin for inpatient goal 140-180.  COVID-19 infection -patient denies any respiratory symptoms or feeling sick.  She is poor historian however.   -- No COVID-specific treatment unless symptoms emerge --Airborne and contact cautions  CKD -likely stage IV, renal function at baseline unclear. -- Avoid nephrotoxins and hypotension --Continue IV hydration for now --Monitor BMP  Hypothyroidism - Continue Synthroid  Depression -continue home Zoloft  Dementia -continue Namenda and Aricept  Pulmonary nodule -7 mm incidental finding on CT scan.  Repeat CT in 6 to 12 months per radiology guidelines.  Outpatient follow-up.   Patient BMI: Body mass index is 24.03 kg/m.   DVT prophylaxis: heparin injection 5,000 Units Start: 03/04/21 1700 SCDs Start: 03/04/21 1656   Diet:  Diet Orders (From admission, onward)     Start     Ordered   03/04/21 1657  Diet Carb Modified Fluid consistency: Thin; Room service appropriate? Yes  Diet effective now       Question Answer Comment  Diet-HS Snack? Nothing   Calorie Level Medium 1600-2000   Fluid consistency: Thin   Room service appropriate? Yes      03/04/21 1656              Code Status: DNR   Subjective 03/05/21  Patient seen in the ED holding for a bed today.  She reports some mild pain in her low back but it is okay as long as she is lying still.  She reports being very thirsty and was requesting ice water.  Says pain right now not that bad.  Denies pain in her upper back at this  time.   Disposition Plan & Communication   Status is: Inpatient  Remains inpatient appropriate because: severity of illness. Fractures of pelvis and sacrum, pending PT/OT evaluations.  On IV fluids.    Consults, Procedures, Significant Events   Consultants:  Orthopedic surgery  Procedures:  Echocardiogram 10/25  Antimicrobials:  Anti-infectives (From admission, onward)    None         Micro    Objective   Vitals:   03/05/21 1100 03/05/21 1130 03/05/21 1348 03/05/21 1542  BP: 127/65 (!) 148/65 (!) 179/70 (!) 156/63  Pulse: 87 84 77 77  Resp: 16 19 16 16   Temp:   (!) 97.5 F (36.4 C) 97.8 F (36.6 C)  TempSrc:   Oral Oral  SpO2: 94% 95% 99% 98%  Weight:      Height:        Intake/Output Summary (Last 24 hours) at 03/05/2021 1709 Last data filed at 03/05/2021 1337 Gross per 24 hour  Intake 120 ml  Output --  Net 120 ml   Filed Weights   03/04/21 1346  Weight: 63.5 kg    Physical Exam:  General exam: awake, alert, no acute distress HEENT: atraumatic, clear conjunctiva, anicteric sclera, moist mucus membranes, hearing grossly normal  Respiratory system: CTAB, no wheezes, rales or rhonchi, normal respiratory effort. Cardiovascular system: normal S1/S2, RRR, no pedal edema.   Gastrointestinal system: soft, NT, ND, no HSM felt, +bowel sounds. Central nervous system: Alert, oriented to self and place, no gross focal neurologic deficits, normal speech Skin: dry, intact, normal temperature, no rashes seen on visualized skin Psychiatry: normal mood, congruent affect  Labs   Data Reviewed: I have personally reviewed following labs and imaging studies  CBC: Recent Labs  Lab 03/04/21 1414 03/05/21 0657  WBC 7.1 7.9  NEUTROABS 5.0  --   HGB 11.2* 10.7*  HCT 33.6* 33.4*  MCV 88.0 90.8  PLT 244 665   Basic Metabolic Panel: Recent Labs  Lab 03/04/21 1414 03/05/21 0657  NA 137 132*  K 4.5 5.2*  CL 101 97*  CO2 24 22  GLUCOSE 167* 363*  BUN  37* 39*  CREATININE 2.30* 2.40*  CALCIUM 8.4* 8.3*  MG 2.0  --    GFR: Estimated Creatinine Clearance: 13.7 mL/min (A) (by C-G formula based on SCr of 2.4 mg/dL (H)). Liver Function Tests: Recent Labs  Lab 03/04/21 1414  AST 17  ALT 13  ALKPHOS 80  BILITOT 0.9  PROT 5.9*  ALBUMIN 3.3*   No results for input(s): LIPASE, AMYLASE in the last 168 hours. No results for input(s): AMMONIA in the last 168 hours. Coagulation Profile: Recent Labs  Lab 03/04/21 1414  INR 0.9   Cardiac Enzymes: No results for input(s): CKTOTAL, CKMB, CKMBINDEX, TROPONINI in the last 168 hours. BNP (last 3 results) No results for input(s): PROBNP in the last 8760 hours. HbA1C: No results for input(s): HGBA1C in the last 72 hours. CBG: Recent Labs  Lab 03/04/21 1633 03/04/21 1810 03/05/21 0734 03/05/21 1223 03/05/21 1636  GLUCAP 161* 144* 354* 210* 177*   Lipid Profile: No results for input(s): CHOL, HDL, LDLCALC, TRIG, CHOLHDL, LDLDIRECT in  the last 72 hours. Thyroid Function Tests: No results for input(s): TSH, T4TOTAL, FREET4, T3FREE, THYROIDAB in the last 72 hours. Anemia Panel: No results for input(s): VITAMINB12, FOLATE, FERRITIN, TIBC, IRON, RETICCTPCT in the last 72 hours. Sepsis Labs: Recent Labs  Lab 03/04/21 1414 03/04/21 1746  LATICACIDVEN 2.6* 1.5    Recent Results (from the past 240 hour(s))  Blood culture (routine single)     Status: None (Preliminary result)   Collection Time: 03/04/21  2:34 PM   Specimen: BLOOD  Result Value Ref Range Status   Specimen Description BLOOD BLOOD LEFT HAND  Final   Special Requests   Final    BOTTLES DRAWN AEROBIC AND ANAEROBIC Blood Culture adequate volume   Culture   Final    NO GROWTH < 24 HOURS Performed at [facility], [address], [place], [place] [address]    Report Status PENDING  Incomplete  Resp Panel by RT-PCR (Flu A&B, Covid) Nasopharyngeal Swab     Status: Abnormal   Collection Time: 03/04/21  5:40 PM    Specimen: Nasopharyngeal Swab; Nasopharyngeal(NP) swabs in vial transport medium  Result Value Ref Range Status   SARS Coronavirus 2 by RT PCR POSITIVE (A) NEGATIVE Final    Comment: RESULT CALLED TO, READ BACK BY AND VERIFIED WITH: GIORGIA HAHLE AT 8366 ON 03/04/21 BY SS (NOTE) SARS-CoV-2 target nucleic acids are DETECTED.  The SARS-CoV-2 RNA is generally detectable in upper respiratory specimens during the acute phase of infection. Positive results are indicative of the presence of the identified virus, but do not rule out bacterial infection or co-infection with other pathogens not detected by the test. Clinical correlation with patient history and other diagnostic information is necessary to determine patient infection status. The expected result is Negative.  Fact Sheet for Patients: EntrepreneurPulse.com.au  Fact Sheet for Healthcare Providers: IncredibleEmployment.be  This test is not yet approved or cleared by the [place] FDA and  has been authorized for detection and/or diagnosis of SARS-CoV-2 by FDA under an Emergency Use Authorization (EUA).  This EUA will remain in effect (meaning this test  can be used) for the duration of  the COVID-19 declaration under Section 564(b)(1) of the Act, 21 U.S.C. section 360bbb-3(b)(1), unless the authorization is terminated or revoked sooner.     Influenza A by PCR NEGATIVE NEGATIVE Final   Influenza B by PCR NEGATIVE NEGATIVE Final    Comment: (NOTE) The Xpert Xpress SARS-CoV-2/FLU/RSV plus assay is intended as an aid in the diagnosis of influenza from Nasopharyngeal swab specimens and should not be used as a sole basis for treatment. Nasal washings and aspirates are unacceptable for Xpert Xpress SARS-CoV-2/FLU/RSV testing.  Fact Sheet for Patients: EntrepreneurPulse.com.au  Fact Sheet for Healthcare Providers: IncredibleEmployment.be  This test is  not yet approved or cleared by the [place] FDA and has been authorized for detection and/or diagnosis of SARS-CoV-2 by FDA under an Emergency Use Authorization (EUA). This EUA will remain in effect (meaning this test can be used) for the duration of the COVID-19 declaration under Section 564(b)(1) of the Act, 21 U.S.C. section 360bbb-3(b)(1), unless the authorization is terminated or revoked.  Performed at [facility], [place], [place] [address]       Imaging Studies   CT HEAD WO CONTRAST (5MM)  Result Date: 03/04/2021 CLINICAL DATA:  Head trauma, minor (Age >= 65y) EXAM: CT HEAD WITHOUT CONTRAST TECHNIQUE: Contiguous axial images were obtained from the base of the skull through the vertex  without intravenous contrast. COMPARISON:  None. FINDINGS: Brain: No evidence of acute intracranial hemorrhage or extra-axial collection.No evidence of mass lesion/concern mass effect.The ventricles appear mildly enlarged but proportional to degree of cerebral atrophy.Confluent periventricular and subcortical white matter hypoattenuation, which is nonspecific but likely sequela of chronic small vessel ischemic disease.Mild cerebral atrophy Vascular: Bilateral carotid calcifications. Skull: Normal. Negative for fracture or focal lesion. Sinuses/Orbits: No acute finding. Other: None. IMPRESSION: No acute intracranial abnormality. Sequela of chronic small vessel ischemic disease and cerebral atrophy. Electronically Signed   By: Maurine Simmering M.D.   On: 03/04/2021 14:24   CT Cervical Spine Wo Contrast  Result Date: 03/04/2021 CLINICAL DATA:  Neck trauma (Age >= 65y) EXAM: CT CERVICAL SPINE WITHOUT CONTRAST TECHNIQUE: Multidetector CT imaging of the cervical spine was performed without intravenous contrast. Multiplanar CT image reconstructions were also generated. COMPARISON:  None. FINDINGS: Alignment: Straightening of the normal cervical lordosis. Trace anterolisthesis at C4-C5.  Skull base and vertebrae: There is no evidence of acute cervical spine fracture. Mild anterior height loss of C7 which is likely chronic. There is no aggressive osseous lesion. Soft tissues and spinal canal: No prevertebral fluid or swelling. No visible canal hematoma. Disc levels: Multilevel degenerative disc disease, severe at C5-C6 and moderate at C6-C7 and C7-T1. There is multilevel facet arthropathy bilaterally. Upper chest: Please see same-day separately dictated CT of the chest. Other: Atrophic thyroid. IMPRESSION: No acute cervical spine fracture. Mild anterior height loss of C7 which is favored to be chronic. Multilevel degenerative disc disease, worst from C5-T1. Electronically Signed   By: Maurine Simmering M.D.   On: 03/04/2021 14:28   DG Pelvis Portable  Result Date: 03/04/2021 CLINICAL DATA:  Altered mental status, questionable sepsis EXAM: PORTABLE PELVIS 1-2 VIEWS COMPARISON:  Portable exam 1317 hours without priors for comparison FINDINGS: Osseous demineralization. Hip and SI joint spaces preserved. No acute fracture, dislocation, or bone destruction. Scattered atherosclerotic calcifications. IMPRESSION: No acute abnormalities. Electronically Signed   By: Lavonia Dana M.D.   On: 03/04/2021 13:52   CT CHEST ABDOMEN PELVIS W CONTRAST  Result Date: 03/04/2021 CLINICAL DATA:  F fall, unresponsive all moderate sponsored EXAM: CT CHEST, ABDOMEN, AND PELVIS WITH CONTRAST TECHNIQUE: Multidetector CT imaging of the chest, abdomen and pelvis was performed following the standard protocol during bolus administration of intravenous contrast. CONTRAST:  75mL OMNIPAQUE IOHEXOL 300 MG/ML  SOLN COMPARISON:  None. FINDINGS: CT CHEST FINDINGS Cardiovascular: Normal cardiac size.No pericardial disease.Coronary artery and mitral annular calcifications.Mild atherosclerotic calcifications of the thoracic aorta. Mediastinum/Nodes: No lymphadenopathy.Atrophic thyroid.Patulous esophagus with intraluminal gas and fluid.  Lungs/Pleura: There is a 7 mm right apical ground-glass nodular opacity (series 4, image 24). There is no other focal airspace disease. There is atelectasis in the lingula and right middle lobe. Scattered lung scarring. No pleural effusion or pneumothorax. Musculoskeletal: Age-indeterminate central compression deformity of T2.Multilevel degenerative changes of the spine. Osteopenia. No suspicious lytic or blastic lesions. CT ABDOMEN PELVIS FINDINGS Hepatobiliary: No hepatic injury or perihepatic hematoma. Multiple calcified hepatic granulomas. The gallbladder is unremarkable. Pancreas: Unremarkable. No pancreatic ductal dilatation or surrounding inflammatory changes. Spleen: No splenic injury or perisplenic hematoma. Multiple splenic granulomas. Adrenals/Urinary Tract: No adrenal hemorrhage or renal injury identified. Bladder is unremarkable. No renal laceration. No hydronephrosis or nephrolithiasis. Small left renal cyst, subcentimeter. Bladder is mildly distended with wall thickening likely related to underdistention. Stomach/Bowel: The stomach is within normal limits. There is no evidence of bowel obstruction. The appendix is normal. No acute inflammatory process. No evidence  of acute bowel injury. Sigmoid diverticulosis. Vascular/Lymphatic: Aorta iliac atherosclerotic calcifications. No AAA. No lymphadenopathy. Reproductive: Unremarkable. Other: No abdominal wall hernia or abnormality. No abdominopelvic ascites. Musculoskeletal: There is a nondisplaced right superior pubic ramus/pubic body fracture. Nondisplaced right sacral fracture in zone 1 (series 2, images 91-93) prior L1 vertebral plasty for an L1 burst fracture, as described on separately dictated lumbar spine CT. There is multilevel degenerative disc disease, worst at L2-L3 and L3-L4. IMPRESSION: Nondisplaced right superior pubic ramus/pubic body fracture. Nondisplaced right sacral fracture in zone 1. Age-indeterminate central compression deformity of  T2, as described on separately dictated thoracic spine CT. No evidence of acute solid organ injury. Chronic findings as described above. 7 mm right apical ground-glass pulmonary nodule, which is likely infectious/inflammatory. Recommend a non-contrast Chest CT at 6-12 months to confirm persistence, then additional non-contrast Chest CTs every 2 years until 5 years. If nodule grows or develops solid component(s), consider resection. These guidelines do not apply to immunocompromised patients and patients with cancer. Follow up in patients with significant comorbidities as clinically warranted. Reference: Radiology. 2017; 284(1):228-43. Electronically Signed   By: Maurine Simmering M.D.   On: 03/04/2021 14:44   US Carotid Bilateral  Result Date: 03/04/2021 CLINICAL DATA:  Syncope. Former smoker. Diabetic. Hyperlipidemia. Hypertension. EXAM: BILATERAL CAROTID DUPLEX ULTRASOUND TECHNIQUE: Pearline Cables scale imaging, color Doppler and duplex ultrasound were performed of bilateral carotid and vertebral arteries in the neck. COMPARISON:  None. FINDINGS: Criteria: Quantification of carotid stenosis is based on velocity parameters that correlate the residual internal carotid diameter with NASCET-based stenosis levels, using the diameter of the distal internal carotid lumen as the denominator for stenosis measurement. The following velocity measurements were obtained: RIGHT ICA: 74 cm/sec CCA: 60 cm/sec SYSTOLIC ICA/CCA RATIO:  1.2 ECA: 132 cm/sec LEFT ICA: 89 cm/sec CCA: 64 cm/sec SYSTOLIC ICA/CCA RATIO:  1.4 ECA: 153 cm/sec RIGHT CAROTID ARTERY: Mild to moderate calcified plaque. RIGHT VERTEBRAL ARTERY:  Antegrade flow. LEFT CAROTID ARTERY:  Mild to moderate calcified plaque. LEFT VERTEBRAL ARTERY:  Antegrade flow. IMPRESSION: No severe stenosis or occlusion identified. Electronically Signed   By: Iven Finn M.D.   On: 03/04/2021 20:57   CT T-SPINE NO CHARGE  Result Date: 03/04/2021 CLINICAL DATA:  Fall EXAM: CT Thoracic  and Lumbar spine without contrast TECHNIQUE: Multiplanar CT images of the thoracic and lumbar spine were reconstructed from contemporary CT of the Chest, Abdomen, and Pelvis CONTRAST:  No dissection COMPARISON:  None FINDINGS: CT THORACIC SPINE FINDINGS Alignment: Physiologic Vertebrae: There is an age-indeterminate central compression deformity of T2 with approximately 30% height loss. No bony retropulsion. No other evidence of thoracic spine fracture. Diffuse osteopenia. No aggressive osseous lesion/suspicious lytic or blastic lesions. Paraspinal and other soft tissues: Reported separately. Disc levels: Multilevel degenerate endplate changes and disc disease, most prominent at T2-T3. C7-T1 degenerative disc disease and facet arthropathy. CT LUMBAR SPINE FINDINGS Segmentation: Normal Alignment: Trace anterolisthesis at L3-L4. Vertebrae: Osteopenia. Prior L1 burst fracture with postprocedural changes of vertebroplasty. There is 5 mm bony retropulsion of the superior endplate which is presumably chronic. No priors for comparison. No other evidence of fracture. Paraspinal and other soft tissues: Reported separately Disc levels: There is multilevel degenerative disc disease, most severe at L2-L3 and L3-L4. There is moderate multilevel facet arthropathy. There is multilevel disc bulging with likely very degrees of mild-to-moderate spinal canal and neural foraminal narrowing. IMPRESSION: THORACIC SPINE CT Age-indeterminate central compression deformity of T2 with approximately 30% height loss. No bony retropulsion. Multilevel degenerative disc  disease, most prominent at T2-T3. LUMBAR SPINE CT Postprocedural changes of vertebroplasty for a prior L1 burst fracture. There is 5 mm bony retropulsion of the superior endplate which is presumably chronic, no priors for comparison. No other lumbar spine fracture. Multilevel degenerative disc disease and facet arthropathy with disc bulging, most severe at L2-L3 and L3-L4. Likely  varying degrees of mild to moderate spinal canal and neural foraminal narrowing. Electronically Signed   By: Maurine Simmering M.D.   On: 03/04/2021 14:20   CT L-SPINE NO CHARGE  Result Date: 03/04/2021 CLINICAL DATA:  Fall EXAM: CT Thoracic and Lumbar spine without contrast TECHNIQUE: Multiplanar CT images of the thoracic and lumbar spine were reconstructed from contemporary CT of the Chest, Abdomen, and Pelvis CONTRAST:  No dissection COMPARISON:  None FINDINGS: CT THORACIC SPINE FINDINGS Alignment: Physiologic Vertebrae: There is an age-indeterminate central compression deformity of T2 with approximately 30% height loss. No bony retropulsion. No other evidence of thoracic spine fracture. Diffuse osteopenia. No aggressive osseous lesion/suspicious lytic or blastic lesions. Paraspinal and other soft tissues: Reported separately. Disc levels: Multilevel degenerate endplate changes and disc disease, most prominent at T2-T3. C7-T1 degenerative disc disease and facet arthropathy. CT LUMBAR SPINE FINDINGS Segmentation: Normal Alignment: Trace anterolisthesis at L3-L4. Vertebrae: Osteopenia. Prior L1 burst fracture with postprocedural changes of vertebroplasty. There is 5 mm bony retropulsion of the superior endplate which is presumably chronic. No priors for comparison. No other evidence of fracture. Paraspinal and other soft tissues: Reported separately Disc levels: There is multilevel degenerative disc disease, most severe at L2-L3 and L3-L4. There is moderate multilevel facet arthropathy. There is multilevel disc bulging with likely very degrees of mild-to-moderate spinal canal and neural foraminal narrowing. IMPRESSION: THORACIC SPINE CT Age-indeterminate central compression deformity of T2 with approximately 30% height loss. No bony retropulsion. Multilevel degenerative disc disease, most prominent at T2-T3. LUMBAR SPINE CT Postprocedural changes of vertebroplasty for a prior L1 burst fracture. There is 5 mm bony  retropulsion of the superior endplate which is presumably chronic, no priors for comparison. No other lumbar spine fracture. Multilevel degenerative disc disease and facet arthropathy with disc bulging, most severe at L2-L3 and L3-L4. Likely varying degrees of mild to moderate spinal canal and neural foraminal narrowing. Electronically Signed   By: Maurine Simmering M.D.   On: 03/04/2021 14:20   DG Chest Port 1 View  Result Date: 03/04/2021 CLINICAL DATA:  Altered mental status questionable sepsis EXAM: PORTABLE CHEST 1 VIEW COMPARISON:  Portable exam 1315 hours compared to 01/30/2021 FINDINGS: Enlargement of cardiac silhouette. Mediastinal contours and pulmonary vascularity normal. Atherosclerotic calcification aorta. Chronic bronchitic changes. Lungs clear. No pulmonary infiltrate, pleural effusion, or pneumothorax. Small calcified granulomata at lower LEFT lung noted. Diffuse osseous demineralization with prior vertebroplasty at question L1. IMPRESSION: Enlargement of cardiac silhouette and chronic bronchitic changes. No acute abnormalities. Aortic Atherosclerosis (ICD10-I70.0). Electronically Signed   By: Lavonia Dana M.D.   On: 03/04/2021 13:52   ECHOCARDIOGRAM COMPLETE  Result Date: 03/05/2021    ECHOCARDIOGRAM REPORT   Patient Name:   NYASHA RAHILLY Date of Exam: 03/05/2021 Medical Rec #:  [id number]   Height:       64.0 in Accession #:    [id number]  Weight:       140.0 lb Date of Birth:  1929-01-29   BSA:          1.681 m Patient Age:    25 years    BP:  168/67 mmHg Patient Gender: F           HR:           87 bpm. Exam Location:  [facility] Procedure: 2D Echo, Cardiac Doppler and Color Doppler Indications:     Syncope R55  History:         Patient has no prior history of Echocardiogram examinations.                  Risk Factors:Diabetes.  Sonographer:     Sherrie Sport Referring Phys:  [id number] Wyvonnia Dusky Diagnosing Phys: Nelva Bush MD IMPRESSIONS  1. Left ventricular ejection fraction, by  estimation, is >55%. The left ventricle has normal function. Left ventricular endocardial border not optimally defined to evaluate regional wall motion. There is mild left ventricular hypertrophy. Left ventricular diastolic parameters are consistent with Grade I diastolic dysfunction (impaired relaxation). Elevated left atrial pressure.  2. Right ventricular systolic function is normal. The right ventricular size is normal.  3. The mitral valve was not well visualized. Trivial mitral valve regurgitation. Moderate mitral annular calcification.  4. The aortic valve was not well visualized. Aortic valve regurgitation is trivial. FINDINGS  Left Ventricle: Left ventricular ejection fraction, by estimation, is >55%. The left ventricle has normal function. Left ventricular endocardial border not optimally defined to evaluate regional wall motion. The left ventricular internal cavity size was  normal in size. There is mild left ventricular hypertrophy. Left ventricular diastolic parameters are consistent with Grade I diastolic dysfunction (impaired relaxation). Elevated left atrial pressure. Right Ventricle: The right ventricular size is normal. Right vetricular wall thickness was not well visualized. Right ventricular systolic function is normal. Left Atrium: Left atrial size was normal in size. Right Atrium: Right atrial size was normal in size. Pericardium: The pericardium was not well visualized. Mitral Valve: The mitral valve was not well visualized. Moderate mitral annular calcification. Trivial mitral valve regurgitation. MV peak gradient, 12.1 mmHg. The mean mitral valve gradient is 4.0 mmHg. Tricuspid Valve: The tricuspid valve is not well visualized. Tricuspid valve regurgitation is trivial. Aortic Valve: The aortic valve was not well visualized. Aortic valve regurgitation is trivial. Aortic valve mean gradient measures 4.0 mmHg. Aortic valve peak gradient measures 8.8 mmHg. Aortic valve area, by VTI measures 3.61  cm. Pulmonic Valve: The pulmonic valve was not well visualized. Pulmonic valve regurgitation is not visualized. No evidence of pulmonic stenosis. Aorta: The aortic root is normal in size and structure. Pulmonary Artery: The pulmonary artery is not well seen. IAS/Shunts: The interatrial septum was not well visualized.  LEFT VENTRICLE PLAX 2D LVIDd:         3.52 cm   Diastology LVIDs:         2.59 cm   LV e' medial:    3.48 cm/s LV PW:         1.21 cm   LV E/e' medial:  19.3 LV IVS:        0.90 cm   LV e' lateral:   3.37 cm/s LVOT diam:     2.00 cm   LV E/e' lateral: 20.0 LV SV:         80 LV SV Index:   48 LVOT Area:     3.14 cm  RIGHT VENTRICLE RV Basal diam:  2.81 cm RV S prime:     14.10 cm/s LEFT ATRIUM             Index        RIGHT  ATRIUM           Index LA diam:        3.30 cm 1.96 cm/m   RA Area:     10.10 cm LA Vol (A2C):   42.3 ml 25.16 ml/m  RA Volume:   19.30 ml  11.48 ml/m LA Vol (A4C):   29.6 ml 17.61 ml/m LA Biplane Vol: 36.6 ml 21.77 ml/m  AORTIC VALVE                     PULMONIC VALVE AV Area (Vmax):    3.04 cm      PV Vmax:        0.84 m/s AV Area (Vmean):   3.68 cm      PV Vmean:       60.600 cm/s AV Area (VTI):     3.61 cm      PV VTI:         0.137 m AV Vmax:           148.00 cm/s   PV Peak grad:   2.8 mmHg AV Vmean:          92.200 cm/s   PV Mean grad:   1.5 mmHg AV VTI:            0.223 m       RVOT Peak grad: 4 mmHg AV Peak Grad:      8.8 mmHg AV Mean Grad:      4.0 mmHg LVOT Vmax:         143.00 cm/s LVOT Vmean:        108.000 cm/s LVOT VTI:          0.256 m LVOT/AV VTI ratio: 1.15  AORTA Ao Root diam: 3.00 cm MITRAL VALVE                TRICUSPID VALVE MV Area (PHT): 2.99 cm     TR Peak grad:   17.3 mmHg MV Area VTI:   2.63 cm     TR Vmax:        208.00 cm/s MV Peak grad:  12.1 mmHg MV Mean grad:  4.0 mmHg     SHUNTS MV Vmax:       1.74 m/s     Systemic VTI:  0.26 m MV Vmean:      93.6 cm/s    Systemic Diam: 2.00 cm MV Decel Time: 254 msec     Pulmonic VTI:  0.179 m MV E  velocity: 67.30 cm/s MV A velocity: 138.00 cm/s MV E/A ratio:  0.49 Christopher End MD Electronically signed by Nelva Bush MD Signature Date/Time: 03/05/2021/1:07:22 PM    Final      Medications   Scheduled Meds:  donepezil  10 mg Oral QHS   heparin  5,000 Units Subcutaneous Q8H   insulin aspart  0-9 Units Subcutaneous TID WC   insulin glargine-yfgn  10 Units Subcutaneous Daily   levothyroxine  125 mcg Oral Q0600   memantine  10 mg Oral BID   sertraline  25 mg Oral QHS   sodium chloride flush  3 mL Intravenous Q12H   Continuous Infusions:  sodium chloride 75 mL/hr at 03/05/21 1337       LOS: 0 days    Time spent: 30 minutes    Ezekiel Slocumb, DO [facility]  03/05/2021, 5:09 PM      If 7PM-7AM, please contact night-coverage. How to contact the [facility] Attending or Consulting provider 7A -  7P or covering provider during after hours [place], for this patient?    Check the care team in [facility] and look for a) attending/consulting [facility] provider listed and b) the [facility] team listed Log into [email or web address] and use [place]'s universal password to access. If you do not have the password, please contact the hospital operator. Locate the [facility] provider you are looking for under [facility] and page to a number that you can be directly reached. If you still have difficulty reaching the provider, please page the [facility] (Director on Call) for the Hospitalists listed on amion for assistance.

## 2021-03-05 NOTE — Progress Notes (Signed)
*  PRELIMINARY RESULTS* Echocardiogram 2D Echocardiogram has been performed.  Katelyn Arroyo 03/05/2021, 10:21 AM

## 2021-03-05 NOTE — ED Notes (Signed)
Echo at bedside

## 2021-03-05 NOTE — Evaluation (Signed)
Physical Therapy Evaluation Patient Details Name: Katelyn Arroyo MRN: [id number] DOB: 1929-01-07 Today's Date: 03/05/2021  History of Present Illness  Pt is an 87 y/o F with PMH: IDDM, hypothyroidism, dementia, and depression. Pt presents from facility [facility]) after fall/syncopal episode, found to have minimally displaced right superior pubic ramus fracture and right sacral ala fracture.  Clinical Impression  Pt pleasantly confused and willing to participate with PT, but was extremely limited with ability to maintain [place] on the R.  She did show good effort but simply could not tolerate even a single step, even with PT assisting to [facility] and plenty of cuing and encouragement.  Pt will need STR to work back to PLOF for mobility/ambulation.       Recommendations for follow up therapy are one component of a multi-disciplinary discharge planning process, led by the attending physician.  Recommendations may be updated based on patient status, additional functional criteria and insurance authorization.  Follow Up Recommendations Skilled nursing-short term rehab (<3 hours/day)    Assistance Recommended at Discharge Frequent or constant Supervision/Assistance  Functional Status Assessment Patient has had a recent decline in their functional status and demonstrates the ability to make significant improvements in function in a reasonable and predictable amount of time.  Equipment Recommendations       Recommendations for Other Services       Precautions / Restrictions Precautions Precautions: Fall Restrictions Weight Bearing Restrictions: Yes RLE Weight Bearing: Weight bearing as tolerated      Mobility  Bed Mobility Overal bed mobility: Needs Assistance Bed Mobility: Supine to Sit;Sit to Supine     Supine to sit: Min assist Sit to supine: Mod assist;Max assist   General bed mobility comments: Pt showed good effort in getting up to sitting EOB, much heavier assist to get back into  bed    Transfers Overall transfer level: Needs assistance Equipment used: Rolling walker (2 wheels) Transfers: Sit to/from Stand Sit to Stand: Mod assist           General transfer comment: plenty of cuing for set up and UE use, unable to rise on her own, but good effort with direct assist from PT    Ambulation/Gait             General Gait Details: Pt made single attempt to move L foot and was completely unable to bear weight effectively on R calling out in pain and needing direct assist from PT to insure she stayed upright and safely got back to bed  Stairs            Wheelchair Mobility    Modified Rankin (Stroke Patients Only)       Balance Overall balance assessment: Needs assistance   Sitting balance-Leahy Scale: Fair     Standing balance support: Bilateral upper extremity supported Standing balance-Leahy Scale: Poor                               Pertinent Vitals/Pain Pain Assessment: Faces Faces Pain Scale: Hurts even more Pain Location: indicates some minimal pain with in-bed mobility, but calling out in pain with R WBing attempt    Home Living Family/patient expects to be discharged to:: Assisted living Living Arrangements:  (prior notes say she lives with child, pt unable to state)                      Prior Function  Mobility Comments: pt indicates that she is normally able to be somewhat mobile, but unable to accurately relate PLOF       Hand Dominance        Extremity/Trunk Assessment   Upper Extremity Assessment Upper Extremity Assessment: Generalized weakness    Lower Extremity Assessment Lower Extremity Assessment: Generalized weakness (pain with any R hip movement)       Communication   Communication:  (pleasantly confused)  Cognition Arousal/Alertness: Awake/alert Behavior During Therapy: Anxious Overall Cognitive Status: History of cognitive impairments - at baseline (unsure  of tue baseline, but clearly with some confusion)                                          General Comments      Exercises     Assessment/Plan    PT Assessment Patient needs continued PT services  PT Problem List Decreased strength;Decreased range of motion;Decreased activity tolerance;Decreased balance;Decreased mobility;Decreased knowledge of use of DME;Decreased safety awareness;Pain       PT Treatment Interventions DME instruction;Gait training;Stair training;Functional mobility training;Therapeutic activities;Therapeutic exercise;Balance training;Neuromuscular re-education;Patient/family education    PT Goals (Current goals can be found in the Care Plan section)  Acute Rehab PT Goals Patient Stated Goal: walk PT Goal Formulation: With patient Time For Goal Achievement: 03/19/21 Potential to Achieve Goals: Fair    Frequency Min 2X/week   Barriers to discharge        Co-evaluation               AM-PAC PT "6 Clicks" Mobility  Outcome Measure Help needed turning from your back to your side while in a flat bed without using bedrails?: A Little Help needed moving from lying on your back to sitting on the side of a flat bed without using bedrails?: A Little Help needed moving to and from a bed to a chair (including a wheelchair)?: Total Help needed standing up from a chair using your arms (e.g., wheelchair or bedside chair)?: A Lot Help needed to walk in hospital room?: Total Help needed climbing 3-5 steps with a railing? : Total 6 Click Score: 11    End of Session Equipment Utilized During Treatment: Gait belt Activity Tolerance: Patient limited by pain Patient left: with bed alarm set;with call bell/phone within reach Nurse Communication: Mobility status PT Visit Diagnosis: History of falling (Z91.81);Muscle weakness (generalized) (M62.81);Difficulty in walking, not elsewhere classified (R26.2);Pain Pain - Right/Left: Right Pain - part of  body: Hip    Time: 5621-3086 PT Time Calculation (min) (ACUTE ONLY): 17 min   Charges:   PT Evaluation $PT Eval Low Complexity: 1 Low PT Treatments $Therapeutic Activity: 8-22 mins        Aldona Bryner R Kyngston Pickelsimer. DPT 03/05/2021, 5:22 PM

## 2021-03-06 DIAGNOSIS — R55 Syncope and collapse: Secondary | ICD-10-CM | POA: Diagnosis not present

## 2021-03-06 LAB — CBC
HCT: 29.4 % — ABNORMAL LOW (ref 36.0–46.0)
Hemoglobin: 9.9 g/dL — ABNORMAL LOW (ref 12.0–15.0)
MCH: 29.7 pg (ref 26.0–34.0)
MCHC: 33.7 g/dL (ref 30.0–36.0)
MCV: 88.3 fL (ref 80.0–100.0)
Platelets: 197 10*3/uL (ref 150–400)
RBC: 3.33 MIL/uL — ABNORMAL LOW (ref 3.87–5.11)
RDW: 13.5 % (ref 11.5–15.5)
WBC: 6.1 10*3/uL (ref 4.0–10.5)
nRBC: 0 % (ref 0.0–0.2)

## 2021-03-06 LAB — BASIC METABOLIC PANEL
Anion gap: 5 (ref 5–15)
BUN: 37 mg/dL — ABNORMAL HIGH (ref 8–23)
CO2: 23 mmol/L (ref 22–32)
Calcium: 7.9 mg/dL — ABNORMAL LOW (ref 8.9–10.3)
Chloride: 105 mmol/L (ref 98–111)
Creatinine, Ser: 2.5 mg/dL — ABNORMAL HIGH (ref 0.44–1.00)
GFR, Estimated: 18 mL/min — ABNORMAL LOW (ref >60)
Glucose, Bld: 303 mg/dL — ABNORMAL HIGH (ref 70–99)
Potassium: 4.9 mmol/L (ref 3.5–5.1)
Sodium: 133 mmol/L — ABNORMAL LOW (ref 135–145)

## 2021-03-06 LAB — GLUCOSE, CAPILLARY
Glucose-Capillary: 274 mg/dL — ABNORMAL HIGH (ref 70–99)
Glucose-Capillary: 178 mg/dL — ABNORMAL HIGH (ref 70–99)
Glucose-Capillary: 128 mg/dL — ABNORMAL HIGH (ref 70–99)
Glucose-Capillary: 46 mg/dL — ABNORMAL LOW (ref 70–99)
Glucose-Capillary: 112 mg/dL — ABNORMAL HIGH (ref 70–99)

## 2021-03-06 LAB — MRSA NEXT GEN BY PCR, NASAL: MRSA by PCR Next Gen: NOT DETECTED

## 2021-03-06 LAB — C-REACTIVE PROTEIN: CRP: 8.2 mg/dL — ABNORMAL HIGH (ref <1.0)

## 2021-03-06 LAB — MAGNESIUM: Magnesium: 1.9 mg/dL (ref 1.7–2.4)

## 2021-03-06 MED ORDER — DEXTROSE 50 % IV SOLN
INTRAVENOUS | Status: AC
  Filled 2021-03-06: qty 50

## 2021-03-06 MED ORDER — DEXTROSE 50 % IV SOLN
25.0000 mL | Freq: Once | INTRAVENOUS | Status: AC
  Administered 2021-03-06: 25 mL via INTRAVENOUS

## 2021-03-06 NOTE — Progress Notes (Signed)
Inpatient Diabetes Program Recommendations  AACE/ADA: New Consensus Statement on Inpatient Glycemic Control  Target Ranges:  Prepandial:   less than 140 mg/dL      Peak postprandial:   less than 180 mg/dL (1-2 hours)      Critically ill patients:  140 - 180 mg/dL   Results for Katelyn Arroyo, Katelyn Arroyo (MRN [id number]) as of 03/06/2021 08:11  Ref. Range 03/05/2021 07:34 03/05/2021 12:23 03/05/2021 16:36 03/05/2021 17:05 03/05/2021 19:26 03/05/2021 20:56 03/06/2021 08:08  Glucose-Capillary Latest Ref Range: 70 - 99 mg/dL 354 (H) 210 (H) 177 (H) 167 (H) 200 (H) 235 (H) 274 (H)    Review of Glycemic Control  Diabetes history: DM Outpatient Diabetes medications: Lantus 10 units daily, Novolog 4-10 units TID with meals Current orders for Inpatient glycemic control: Semglee 10 units daily, Novolog 0-9 units TID with meals   Inpatient Diabetes Program Recommendations:     Insulin: Fasting glucose 274 mg/dl today. Please consider increasing Semglee 12 units daily and adding Novolog 0-5 units QHS for bedtime correction.    Thanks, Barnie Alderman, RN, MSN, CDE Diabetes Coordinator Inpatient Diabetes Program [phone] (Team Pager from 8am to 5pm)

## 2021-03-06 NOTE — Progress Notes (Signed)
PROGRESS NOTE    Katelyn Arroyo  LKT:[id number] DOB: 14-Apr-1929 DOA: 03/04/2021 PCP: [facility]  145A/145A-AA   Assessment & Plan:   Principal Problem:   Syncope Active Problems:   Essential hypertension   Dementia (HCC)   COPD (chronic obstructive pulmonary disease) (HCC)   Hyperlipidemia   Closed fracture of superior pubic ramus (HCC)   Closed sacral fracture (HCC)   Compression fracture of T2 vertebra (HCC)   COVID-19 virus infection   Insulin dependent type 2 diabetes mellitus ([place])   87 y/o F w/ PMH of dementia, insulin-dependent type 2 diabetes with admission for DKA earlier this year, hypothyroidism, depression who presented to the ED from SNF after a syncopal episode.  She was reportedly found unresponsive in her shower facedown by staff.  Patient is poor historian due to dementia and limited history was provided by SNF staff per EMS.  She was reportedly hypotensive with EMS and given half liter bolus IV fluids.   On admission was complaining of abdominal low back pain without radiation.     Patient was found to be positive for COVID-19 but denying respiratory symptoms or feeling sick.     Imaging studies revealed minimally displaced superior pubic ramus fracture and right sacral ala fracture.  Orthopedic surgery was consulted.     Syncopal episode  -etiology unclear given very limited history.  Differential includes hypoglycemia, vasovagal syncope, arrhythmia versus neurogenic.   Head CT without acute intracranial findings. Neck CT without acute C-spine fracture. Pelvic and sacral fractures as below. Evaluation: Carotid ultrasound negative. Echo with EF greater than 34%, grade 1 diastolic dysfunction, mild LVH   Hyperkalemia -mild --resolved with Lokelma x1 --Monitor   Right superior right ramus fracture Right sacral ala fracture  -likely sustained a fall during syncopal episode. --orthopedic surgery consulted, rec Conservative management with  pain control Plan: --PT/OT rec SNF  --Weightbearing as tolerated --Follow-up with [facility]o at [facility] in 2 weeks  Insulin-dependent type 2 diabetes, poorly controlled -cont glargine 10u daily --SSI TID  COVID-19 infection, asymptomatic -- No COVID-specific treatment unless symptoms emerge --Airborne and contact cautions   CKD -likely stage IV, renal function at baseline unclear. --d/c MIVF and encourage oral hydration  Hypothyroidism  - Continue Synthroid   Depression  -continue home Zoloft  Dementia  -continue Namenda and Aricept  Pulmonary nodule  -7 mm incidental finding on CT scan.  Repeat CT in 6 to 12 months per radiology guidelines.  Outpatient follow-up.   DVT prophylaxis: Heparin SQ Code Status: DNR  Family Communication:  Level of care: Med-Surg Dispo:   The patient is from: ALF Anticipated d/c is to: SNF Anticipated d/c date is: whenever bed available Patient currently is medically ready to d/c.   Subjective and Interval History:  Pt ate her meal ok.  Pain only with movement.  No cough.  No dyspnea.   Objective: Vitals:   03/06/21 0532 03/06/21 0803 03/06/21 1149 03/06/21 1543  BP: (!) 155/74 (!) 169/87 121/67 (!) 159/71  Pulse: 83 84 80 82  Resp: 16 14 15 15   Temp: 98.1 F (36.7 C) 98.3 F (36.8 C) 97.9 F (36.6 C) 98 F (36.7 C)  TempSrc: Oral     SpO2: 94% 94% 94% 97%  Weight:      Height:        Intake/Output Summary (Last 24 hours) at 03/06/2021 1918 Last data filed at 03/06/2021 1605 Gross per 24 hour  Intake 2750.86 ml  Output 450 ml  Net 2300.86 ml  Filed Weights   03/04/21 1346 03/06/21 0500  Weight: 63.5 kg 66.3 kg    Examination:   Constitutional: NAD, AAOx3 HEENT: conjunctivae and lids normal, EOMI CV: No cyanosis.   RESP: normal respiratory effort, on RA Extremities: No effusions, edema in BLE SKIN: warm, dry Neuro: II - XII grossly intact.   Psych: Normal mood and affect.  Appropriate judgement and reason   Data  Reviewed: I have personally reviewed following labs and imaging studies  CBC: Recent Labs  Lab 03/04/21 1414 03/05/21 0657 03/06/21 0218  WBC 7.1 7.9 6.1  NEUTROABS 5.0  --   --   HGB 11.2* 10.7* 9.9*  HCT 33.6* 33.4* 29.4*  MCV 88.0 90.8 88.3  PLT 244 213 295   Basic Metabolic Panel: Recent Labs  Lab 03/04/21 1414 03/05/21 0657 03/06/21 0218  NA 137 132* 133*  K 4.5 5.2* 4.9  CL 101 97* 105  CO2 24 22 23   GLUCOSE 167* 363* 303*  BUN 37* 39* 37*  CREATININE 2.30* 2.40* 2.50*  CALCIUM 8.4* 8.3* 7.9*  MG 2.0  --  1.9   GFR: Estimated Creatinine Clearance: 14.3 mL/min (A) (by C-G formula based on SCr of 2.5 mg/dL (H)). Liver Function Tests: Recent Labs  Lab 03/04/21 1414  AST 17  ALT 13  ALKPHOS 80  BILITOT 0.9  PROT 5.9*  ALBUMIN 3.3*   No results for input(s): LIPASE, AMYLASE in the last 168 hours. No results for input(s): AMMONIA in the last 168 hours. Coagulation Profile: Recent Labs  Lab 03/04/21 1414  INR 0.9   Cardiac Enzymes: No results for input(s): CKTOTAL, CKMB, CKMBINDEX, TROPONINI in the last 168 hours. BNP (last 3 results) No results for input(s): PROBNP in the last 8760 hours. HbA1C: No results for input(s): HGBA1C in the last 72 hours. CBG: Recent Labs  Lab 03/05/21 1926 03/05/21 2056 03/06/21 0808 03/06/21 1150 03/06/21 1614  GLUCAP 200* 235* 274* 178* 128*   Lipid Profile: No results for input(s): CHOL, HDL, LDLCALC, TRIG, CHOLHDL, LDLDIRECT in the last 72 hours. Thyroid Function Tests: No results for input(s): TSH, T4TOTAL, FREET4, T3FREE, THYROIDAB in the last 72 hours. Anemia Panel: No results for input(s): VITAMINB12, FOLATE, FERRITIN, TIBC, IRON, RETICCTPCT in the last 72 hours. Sepsis Labs: Recent Labs  Lab 03/04/21 1414 03/04/21 1746  LATICACIDVEN 2.6* 1.5    Recent Results (from the past 240 hour(s))  Blood culture (routine single)     Status: None (Preliminary result)   Collection Time: 03/04/21  2:34 PM    Specimen: BLOOD  Result Value Ref Range Status   Specimen Description BLOOD BLOOD LEFT HAND  Final   Special Requests   Final    BOTTLES DRAWN AEROBIC AND ANAEROBIC Blood Culture adequate volume   Culture   Final    NO GROWTH 2 DAYS Performed at [facility], [address] [place], [place] [address]    Report Status PENDING  Incomplete  Resp Panel by RT-PCR (Flu A&B, Covid) Nasopharyngeal Swab     Status: Abnormal   Collection Time: 03/04/21  5:40 PM   Specimen: Nasopharyngeal Swab; Nasopharyngeal(NP) swabs in vial transport medium  Result Value Ref Range Status   SARS Coronavirus 2 by RT PCR POSITIVE (A) NEGATIVE Final    Comment: RESULT CALLED TO, READ BACK BY AND VERIFIED WITH: GIORGIA HAHLE AT 2440 ON 03/04/21 BY SS (NOTE) SARS-CoV-2 target nucleic acids are DETECTED.  The SARS-CoV-2 RNA is generally detectable in upper respiratory specimens during the acute phase  of infection. Positive results are indicative of the presence of the identified virus, but do not rule out bacterial infection or co-infection with other pathogens not detected by the test. Clinical correlation with patient history and other diagnostic information is necessary to determine patient infection status. The expected result is Negative.  Fact Sheet for Patients: EntrepreneurPulse.com.au  Fact Sheet for Healthcare Providers: IncredibleEmployment.be  This test is not yet approved or cleared by the [place] FDA and  has been authorized for detection and/or diagnosis of SARS-CoV-2 by FDA under an Emergency Use Authorization (EUA).  This EUA will remain in effect (meaning this test  can be used) for the duration of  the COVID-19 declaration under Section 564(b)(1) of the Act, 21 U.S.C. section 360bbb-3(b)(1), unless the authorization is terminated or revoked sooner.     Influenza A by PCR NEGATIVE NEGATIVE Final   Influenza B by PCR NEGATIVE NEGATIVE  Final    Comment: (NOTE) The Xpert Xpress SARS-CoV-2/FLU/RSV plus assay is intended as an aid in the diagnosis of influenza from Nasopharyngeal swab specimens and should not be used as a sole basis for treatment. Nasal washings and aspirates are unacceptable for Xpert Xpress SARS-CoV-2/FLU/RSV testing.  Fact Sheet for Patients: EntrepreneurPulse.com.au  Fact Sheet for Healthcare Providers: IncredibleEmployment.be  This test is not yet approved or cleared by the [place] FDA and has been authorized for detection and/or diagnosis of SARS-CoV-2 by FDA under an Emergency Use Authorization (EUA). This EUA will remain in effect (meaning this test can be used) for the duration of the COVID-19 declaration under Section 564(b)(1) of the Act, 21 U.S.C. section 360bbb-3(b)(1), unless the authorization is terminated or revoked.  Performed at [facility], [address] [place], [place] [address]   Urine Culture     Status: Abnormal   Collection Time: 03/04/21  5:40 PM   Specimen: In/Out Cath Urine  Result Value Ref Range Status   Specimen Description   Final    IN/OUT CATH URINE Performed at [facility], [address], [place], [place] [address]    Special Requests   Final    NONE Performed at [facility], [address] [place], [place] [address]    Culture (A)  Final    <10,000 COLONIES/mL INSIGNIFICANT GROWTH Performed at [facility], [place] [address], [place], [place] [address]    Report Status 03/05/2021 FINAL  Final  MRSA Next Gen by PCR, Nasal     Status: None   Collection Time: 03/06/21  4:40 AM   Specimen: Nasal Mucosa; Nasal Swab  Result Value Ref Range Status   MRSA by PCR Next Gen NOT DETECTED NOT DETECTED Final    Comment: (NOTE) The GeneXpert MRSA Assay (FDA approved for NASAL specimens only), is one component of a comprehensive MRSA colonization surveillance program. It is not  intended to diagnose MRSA infection nor to guide or monitor treatment for MRSA infections. Test performance is not FDA approved in patients less than 63 years old. Performed at [facility], [address], [place], [place] [address]       Radiology Studies: US Carotid Bilateral  Result Date: 03/04/2021 CLINICAL DATA:  Syncope. Former smoker. Diabetic. Hyperlipidemia. Hypertension. EXAM: BILATERAL CAROTID DUPLEX ULTRASOUND TECHNIQUE: Pearline Cables scale imaging, color Doppler and duplex ultrasound were performed of bilateral carotid and vertebral arteries in the neck. COMPARISON:  None. FINDINGS: Criteria: Quantification of carotid stenosis is based on velocity parameters that correlate the residual internal carotid diameter with NASCET-based  stenosis levels, using the diameter of the distal internal carotid lumen as the denominator for stenosis measurement. The following velocity measurements were obtained: RIGHT ICA: 74 cm/sec CCA: 60 cm/sec SYSTOLIC ICA/CCA RATIO:  1.2 ECA: 132 cm/sec LEFT ICA: 89 cm/sec CCA: 64 cm/sec SYSTOLIC ICA/CCA RATIO:  1.4 ECA: 153 cm/sec RIGHT CAROTID ARTERY: Mild to moderate calcified plaque. RIGHT VERTEBRAL ARTERY:  Antegrade flow. LEFT CAROTID ARTERY:  Mild to moderate calcified plaque. LEFT VERTEBRAL ARTERY:  Antegrade flow. IMPRESSION: No severe stenosis or occlusion identified. Electronically Signed   By: Iven Finn M.D.   On: 03/04/2021 20:57   ECHOCARDIOGRAM COMPLETE  Result Date: 03/05/2021    ECHOCARDIOGRAM REPORT   Patient Name:   AANIKA DEFOOR Date of Exam: 03/05/2021 Medical Rec #:  [id number]   Height:       64.0 in Accession #:    [id number]  Weight:       140.0 lb Date of Birth:  12-30-1928   BSA:          1.681 m Patient Age:    24 years    BP:           168/67 mmHg Patient Gender: F           HR:           87 bpm. Exam Location:  [facility] Procedure: 2D Echo, Cardiac Doppler and Color Doppler Indications:     Syncope R55  History:         Patient has no  prior history of Echocardiogram examinations.                  Risk Factors:Diabetes.  Sonographer:     Sherrie Sport Referring Phys:  [id number] Wyvonnia Dusky Diagnosing Phys: Nelva Bush MD IMPRESSIONS  1. Left ventricular ejection fraction, by estimation, is >55%. The left ventricle has normal function. Left ventricular endocardial border not optimally defined to evaluate regional wall motion. There is mild left ventricular hypertrophy. Left ventricular diastolic parameters are consistent with Grade I diastolic dysfunction (impaired relaxation). Elevated left atrial pressure.  2. Right ventricular systolic function is normal. The right ventricular size is normal.  3. The mitral valve was not well visualized. Trivial mitral valve regurgitation. Moderate mitral annular calcification.  4. The aortic valve was not well visualized. Aortic valve regurgitation is trivial. FINDINGS  Left Ventricle: Left ventricular ejection fraction, by estimation, is >55%. The left ventricle has normal function. Left ventricular endocardial border not optimally defined to evaluate regional wall motion. The left ventricular internal cavity size was  normal in size. There is mild left ventricular hypertrophy. Left ventricular diastolic parameters are consistent with Grade I diastolic dysfunction (impaired relaxation). Elevated left atrial pressure. Right Ventricle: The right ventricular size is normal. Right vetricular wall thickness was not well visualized. Right ventricular systolic function is normal. Left Atrium: Left atrial size was normal in size. Right Atrium: Right atrial size was normal in size. Pericardium: The pericardium was not well visualized. Mitral Valve: The mitral valve was not well visualized. Moderate mitral annular calcification. Trivial mitral valve regurgitation. MV peak gradient, 12.1 mmHg. The mean mitral valve gradient is 4.0 mmHg. Tricuspid Valve: The tricuspid valve is not well visualized. Tricuspid valve  regurgitation is trivial. Aortic Valve: The aortic valve was not well visualized. Aortic valve regurgitation is trivial. Aortic valve mean gradient measures 4.0 mmHg. Aortic valve peak gradient measures 8.8 mmHg. Aortic valve area, by VTI measures 3.61 cm. Pulmonic Valve: The pulmonic valve was  not well visualized. Pulmonic valve regurgitation is not visualized. No evidence of pulmonic stenosis. Aorta: The aortic root is normal in size and structure. Pulmonary Artery: The pulmonary artery is not well seen. IAS/Shunts: The interatrial septum was not well visualized.  LEFT VENTRICLE PLAX 2D LVIDd:         3.52 cm   Diastology LVIDs:         2.59 cm   LV e' medial:    3.48 cm/s LV PW:         1.21 cm   LV E/e' medial:  19.3 LV IVS:        0.90 cm   LV e' lateral:   3.37 cm/s LVOT diam:     2.00 cm   LV E/e' lateral: 20.0 LV SV:         80 LV SV Index:   48 LVOT Area:     3.14 cm  RIGHT VENTRICLE RV Basal diam:  2.81 cm RV S prime:     14.10 cm/s LEFT ATRIUM             Index        RIGHT ATRIUM           Index LA diam:        3.30 cm 1.96 cm/m   RA Area:     10.10 cm LA Vol (A2C):   42.3 ml 25.16 ml/m  RA Volume:   19.30 ml  11.48 ml/m LA Vol (A4C):   29.6 ml 17.61 ml/m LA Biplane Vol: 36.6 ml 21.77 ml/m  AORTIC VALVE                     PULMONIC VALVE AV Area (Vmax):    3.04 cm      PV Vmax:        0.84 m/s AV Area (Vmean):   3.68 cm      PV Vmean:       60.600 cm/s AV Area (VTI):     3.61 cm      PV VTI:         0.137 m AV Vmax:           148.00 cm/s   PV Peak grad:   2.8 mmHg AV Vmean:          92.200 cm/s   PV Mean grad:   1.5 mmHg AV VTI:            0.223 m       RVOT Peak grad: 4 mmHg AV Peak Grad:      8.8 mmHg AV Mean Grad:      4.0 mmHg LVOT Vmax:         143.00 cm/s LVOT Vmean:        108.000 cm/s LVOT VTI:          0.256 m LVOT/AV VTI ratio: 1.15  AORTA Ao Root diam: 3.00 cm MITRAL VALVE                TRICUSPID VALVE MV Area (PHT): 2.99 cm     TR Peak grad:   17.3 mmHg MV Area VTI:   2.63 cm      TR Vmax:        208.00 cm/s MV Peak grad:  12.1 mmHg MV Mean grad:  4.0 mmHg     SHUNTS MV Vmax:       1.74 m/s     Systemic VTI:  0.26 m MV  Vmean:      93.6 cm/s    Systemic Diam: 2.00 cm MV Decel Time: 254 msec     Pulmonic VTI:  0.179 m MV E velocity: 67.30 cm/s MV A velocity: 138.00 cm/s MV E/A ratio:  0.49 Christopher End MD Electronically signed by Nelva Bush MD Signature Date/Time: 03/05/2021/1:07:22 PM    Final      Scheduled Meds:  donepezil  10 mg Oral QHS   heparin  5,000 Units Subcutaneous Q8H   insulin aspart  0-9 Units Subcutaneous TID WC   insulin glargine-yfgn  10 Units Subcutaneous Daily   levothyroxine  125 mcg Oral Q0600   memantine  10 mg Oral BID   sertraline  25 mg Oral QHS   sodium chloride flush  3 mL Intravenous Q12H   Continuous Infusions:   LOS: 1 day     Enzo Bi, MD [facility] If 7PM-7AM, please contact night-coverage 03/06/2021, 7:18 PM

## 2021-03-06 NOTE — TOC Progression Note (Signed)
Transition of Care [facility]) - Progression Note    Patient Details  Name: Katelyn Arroyo MRN: [id number] Date of Birth: 1928/08/19  Transition of Care [facility]) CM/SW [place], RN Phone Number: 03/06/2021, 3:53 PM  Clinical Narrative: Called and spoke with patient daughter, Trixie who put her husband on the phone, they both agreed with STR in [place] and following rehabilitation will fly patient to [place] where they live. They have no facility preference just one in the area. FL2 completed bed search started, unable to get PASSR, website not working.          Expected Discharge Plan and Services                                                 Social Determinants of Health (SDOH) Interventions    Readmission Risk Interventions No flowsheet data found.

## 2021-03-06 NOTE — NC FL2 (Signed)
[place] LEVEL OF CARE SCREENING TOOL     IDENTIFICATION  Patient Name: Katelyn Arroyo Birthdate: 07/04/1928 Sex: female Admission Date (Current Location): 03/04/2021  [facility] and [place] Number:  Engineering geologist and Address:  [facility], [address] [place], [place] [address]      Provider Number: [id number]  Attending Physician Name and Address:  Enzo Bi, MD  Relative Name and Phone Number:  [name], [name] (Daughter)   [phone]    Current Level of Care: Hospital Recommended Level of Care: [place] Prior Approval Number:    Date Approved/Denied:   PASRR Number:    Discharge Plan: SNF    Current Diagnoses: Patient Active Problem List   Diagnosis Date Noted   Closed fracture of superior pubic ramus ([place]) 03/05/2021   Closed sacral fracture ([place]) 03/05/2021   Compression fracture of T2 vertebra ([place]) 03/05/2021   COVID-19 virus infection 03/05/2021   Insulin dependent type 2 diabetes mellitus ([place]) 03/05/2021   Syncope 03/04/2021   DKA (diabetic ketoacidosis) ([place]) 01/30/2021   Essential hypertension 01/30/2021   Dementia ([place]) 01/30/2021   COPD (chronic obstructive pulmonary disease) ([place]) 01/30/2021   Hyperlipidemia 01/30/2021   Elevated troponin 01/30/2021    Orientation RESPIRATION BLADDER Height & Weight     Self  Normal External catheter Weight: 66.3 kg Height:  5\' 4"  (162.6 cm)  BEHAVIORAL SYMPTOMS/MOOD NEUROLOGICAL BOWEL NUTRITION STATUS        Diet  AMBULATORY STATUS COMMUNICATION OF NEEDS Skin   Extensive Assist Does not communicate                         Personal Care Assistance Level of Assistance  Bathing, Feeding, Dressing Bathing Assistance: Limited assistance Feeding assistance: Limited assistance Dressing Assistance: Maximum assistance     Functional Limitations Info  Sight, Hearing, Speech Sight Info: Adequate Hearing Info: Adequate Speech Info: Impaired     SPECIAL CARE FACTORS FREQUENCY  PT (By licensed PT), OT (By licensed OT)     PT Frequency: 5X WEEK OT Frequency: 5X WEEK            Contractures Contractures Info: Present (Right pelvic fracture)    Additional Factors Info  Code Status, Allergies Code Status Info: DNR Allergies Info: Ceftriaxone           Current Medications (03/06/2021):  This is the current hospital active medication list Current Facility-Administered Medications  Medication Dose Route Frequency Provider Last Rate Last Admin   acetaminophen (TYLENOL) tablet 650 mg  650 mg Oral Q6H PRN Wyvonnia Dusky, MD       docusate sodium (COLACE) capsule 200 mg  200 mg Oral BID PRN Wyvonnia Dusky, MD       donepezil (ARICEPT) tablet 10 mg  10 mg Oral QHS Wyvonnia Dusky, MD   10 mg at 03/05/21 2106   heparin injection 5,000 Units  5,000 Units Subcutaneous Q8H Wyvonnia Dusky, MD   5,000 Units at 03/06/21 0626   insulin aspart (novoLOG) injection 0-9 Units  0-9 Units Subcutaneous TID WC Wyvonnia Dusky, MD   2 Units at 03/06/21 1242   insulin glargine-yfgn [facility]) injection 10 Units  10 Units Subcutaneous Daily Nicole Kindred A, DO   10 Units at 03/06/21 0849   levothyroxine (SYNTHROID) tablet 125 mcg  125 mcg Oral Q0600 Wyvonnia Dusky, MD   125 mcg at 03/06/21 0626   memantine (NAMENDA) tablet 10 mg  10  mg Oral BID Wyvonnia Dusky, MD   10 mg at 03/06/21 0849   ondansetron [facility]) injection 4 mg  4 mg Intravenous Q6H PRN Wyvonnia Dusky, MD   4 mg at 03/04/21 1734   oxyCODONE-acetaminophen (PERCOCET/ROXICET) 5-325 MG per tablet 1 tablet  1 tablet Oral Q6H PRN Wyvonnia Dusky, MD   1 tablet at 03/06/21 0849   polyethylene glycol (MIRALAX / GLYCOLAX) packet 17 g  17 g Oral Daily PRN Wyvonnia Dusky, MD       sertraline (ZOLOFT) tablet 25 mg  25 mg Oral QHS Wyvonnia Dusky, MD   25 mg at 03/05/21 2106   sodium chloride flush (NS) 0.9 % injection 3 mL  3 mL Intravenous  Q12H Wyvonnia Dusky, MD   3 mL at 03/06/21 8599     Discharge Medications: Please see discharge summary for a list of discharge medications.  Relevant Imaging Results:  Relevant Lab Results:   Additional Information SS# [id number]  Kerin Salen, RN

## 2021-03-06 NOTE — Evaluation (Signed)
Occupational Therapy Evaluation Patient Details Name: Katelyn Arroyo MRN: [id number] DOB: 11-Mar-1929 Today's Date: 03/06/2021   History of Present Illness Pt is an 87 y/o F with PMH: IDDM, hypothyroidism, dementia, and depression. Pt presents from facility [facility]) after fall/syncopal episode, found to have minimally displaced right superior pubic ramus fracture and right sacral ala fracture.   Clinical Impression   Katelyn Arroyo was seen for OT evaluation this date. Pt presents pleasantly confused. Oriented to self only. She is unable to state date or place. She is aware she is in the hospital 2/2 "pain in my hip" but believes she is in [place]. PLOF limited as pt is unable to provide baseline history. Per chart, pt presented from assisted living facility and was found in the shower. Currently pt demonstrates impairments including generalized weakness, RLE pain, decreased activity tolerance, and impaired cognition which functionally limit her ability to perform ADL/self-care tasks. Pt currently requires MAX A for LB ADL management including bathing and dressing. MAX A +2 for bed/functional mobility, and set-up/supervision assist for UB ADL management.  Pt would benefit from skilled OT services to address noted impairments and functional limitations (see below for any additional details) in order to maximize safety and independence while minimizing falls risk and caregiver burden. Upon hospital discharge, recommend STR to maximize pt safety and return to PLOF.        Recommendations for follow up therapy are one component of a multi-disciplinary discharge planning process, led by the attending physician.  Recommendations may be updated based on patient status, additional functional criteria and insurance authorization.   Follow Up Recommendations  Skilled nursing-short term rehab (<3 hours/day)    Assistance Recommended at Discharge Frequent or constant Supervision/Assistance  Functional Status  Assessment  Patient has had a recent decline in their functional status and demonstrates the ability to make significant improvements in function in a reasonable and predictable amount of time.  Equipment Recommendations  [facility]    Recommendations for Other Services       Precautions / Restrictions Precautions Precautions: Fall Restrictions Weight Bearing Restrictions: Yes RLE Weight Bearing: Weight bearing as tolerated      Mobility Bed Mobility Overal bed mobility: Needs Assistance Bed Mobility: Supine to Sit;Sit to Supine       Sit to supine: Max assist        Transfers Overall transfer level: Needs assistance                        Balance Overall balance assessment: Needs assistance   Sitting balance-Leahy Scale: Poor Sitting balance - Comments: Unable to maintain sitting balance without assist. Limited by pain.                                   ADL either performed or assessed with clinical judgement   ADL Overall ADL's : Needs assistance/impaired                                       General ADL Comments: Pt is significantly limited by impaired cognitions, decreased activity tolerance, generalized weakness, and R hip pain. She requires MAX A for bed mobility this date and is too pain limited to attempt functional mobility. Anticipate MAX +2 for functional transfers for safety. MAX A for LB ADL management. MAX A for bed level  toileting/grooming at this time.     Vision Baseline Vision/History: 1 Wears glasses Ability to See in Adequate Light: 1 Impaired Patient Visual Report: No change from baseline       Perception     Praxis      Pertinent Vitals/Pain Pain Assessment: Faces Faces Pain Scale: Hurts whole lot Pain Location: Significantly limited by pain this date. Endorses "hip pain" at rest and with minimal bed mobility. Pain Descriptors / Indicators: Constant;Grimacing;Guarding;Crying Pain Intervention(s):  Repositioned;Limited activity within patient's tolerance;Monitored during session     Hand Dominance Right   Extremity/Trunk Assessment Upper Extremity Assessment Upper Extremity Assessment: Generalized weakness   Lower Extremity Assessment Lower Extremity Assessment: Generalized weakness;RLE deficits/detail RLE Deficits / Details: Significant discomfort with even minimal RLE movement.       Communication Communication Communication: No difficulties   Cognition Arousal/Alertness: Awake/alert Behavior During Therapy: Anxious Overall Cognitive Status: History of cognitive impairments - at baseline                                 General Comments: Pt is A&O to self only. She remains pleasantly confused t/o session. No family present to determine baseline cognition. Per chart pt with hx of dementia. Clear STM deficits and slowed processing noted. Will continue to monitor.     General Comments       Exercises Other Exercises Other Exercises: Pt education limited 2/2 cognition. Educated on importance of functional activity during hospital stay and complementary pain management techniques. OT facilitates bed mobility/bed level grooming.   Shoulder Instructions      Home Living Family/patient expects to be discharged to:: Assisted living                                        Prior Functioning/Environment               Mobility Comments: Pt is unreliable historian. No family/caregiver available to provide information regarding PLOF. PLOF obtained from chart review. Pt appears to have been able to be ambulating with a device at her facility. Pt states she is generally able to care for herself, but is limited by cognition.          OT Problem List: Decreased strength;Impaired balance (sitting and/or standing);Decreased cognition;Pain;Decreased range of motion;Decreased safety awareness;Decreased activity tolerance;Decreased  coordination;Decreased knowledge of use of DME or AE      OT Treatment/Interventions: Self-care/ADL training;Therapeutic exercise;Energy conservation;Patient/family education;Balance training;Therapeutic activities;DME and/or AE instruction    OT Goals(Current goals can be found in the care plan section) Acute Rehab OT Goals Patient Stated Goal: To have less pain OT Goal Formulation: With patient Time For Goal Achievement: 03/20/21 Potential to Achieve Goals: Fair ADL Goals Pt Will Perform Grooming: with set-up;with supervision;sitting Pt Will Perform Lower Body Dressing: sit to/from stand;with adaptive equipment;with min assist Pt Will Transfer to Toilet: with min assist;ambulating;bedside commode Pt Will Perform Toileting - Clothing Manipulation and hygiene: sit to/from stand;with adaptive equipment;with min assist  OT Frequency: Min 1X/week   Barriers to D/C:            Co-evaluation              AM-PAC OT "6 Clicks" Daily Activity     Outcome Measure Help from another person eating meals?: A Little Help from another person taking care of personal grooming?:  A Little Help from another person toileting, which includes using toliet, bedpan, or urinal?: A Lot Help from another person bathing (including washing, rinsing, drying)?: A Lot Help from another person to put on and taking off regular upper body clothing?: A Little Help from another person to put on and taking off regular lower body clothing?: A Lot 6 Click Score: 15   End of Session    Activity Tolerance: Patient limited by pain Patient left: in bed;with call bell/phone within reach;with bed alarm set  OT Visit Diagnosis: Other abnormalities of gait and mobility (R26.89);Pain Pain - Right/Left: Right Pain - part of body: Hip                Time: 1359-1425 OT Time Calculation (min): 26 min Charges:  OT General Charges $OT Visit: 1 Visit OT Evaluation $OT Eval Moderate Complexity: 1 Mod OT  Treatments $Self Care/Home Management : 8-22 mins  Shara Blazing, M.S., OTR/L Ascom: [phone] 03/06/21, 3:48 PM

## 2021-03-07 DIAGNOSIS — R55 Syncope and collapse: Secondary | ICD-10-CM | POA: Diagnosis not present

## 2021-03-07 LAB — GLUCOSE, CAPILLARY
Glucose-Capillary: 150 mg/dL — ABNORMAL HIGH (ref 70–99)
Glucose-Capillary: 185 mg/dL — ABNORMAL HIGH (ref 70–99)
Glucose-Capillary: 265 mg/dL — ABNORMAL HIGH (ref 70–99)
Glucose-Capillary: 280 mg/dL — ABNORMAL HIGH (ref 70–99)
Glucose-Capillary: 296 mg/dL — ABNORMAL HIGH (ref 70–99)

## 2021-03-07 LAB — CBC
HCT: 30.3 % — ABNORMAL LOW (ref 36.0–46.0)
Hemoglobin: 10.1 g/dL — ABNORMAL LOW (ref 12.0–15.0)
MCH: 28.5 pg (ref 26.0–34.0)
MCHC: 33.3 g/dL (ref 30.0–36.0)
MCV: 85.6 fL (ref 80.0–100.0)
Platelets: 197 10*3/uL (ref 150–400)
RBC: 3.54 MIL/uL — ABNORMAL LOW (ref 3.87–5.11)
RDW: 13.3 % (ref 11.5–15.5)
WBC: 6.6 10*3/uL (ref 4.0–10.5)
nRBC: 0 % (ref 0.0–0.2)

## 2021-03-07 LAB — BASIC METABOLIC PANEL
Anion gap: 4 — ABNORMAL LOW (ref 5–15)
BUN: 29 mg/dL — ABNORMAL HIGH (ref 8–23)
CO2: 24 mmol/L (ref 22–32)
Calcium: 8 mg/dL — ABNORMAL LOW (ref 8.9–10.3)
Chloride: 108 mmol/L (ref 98–111)
Creatinine, Ser: 2.18 mg/dL — ABNORMAL HIGH (ref 0.44–1.00)
GFR, Estimated: 21 mL/min — ABNORMAL LOW (ref >60)
Glucose, Bld: 141 mg/dL — ABNORMAL HIGH (ref 70–99)
Potassium: 4.5 mmol/L (ref 3.5–5.1)
Sodium: 136 mmol/L (ref 135–145)

## 2021-03-07 LAB — MAGNESIUM: Magnesium: 1.9 mg/dL (ref 1.7–2.4)

## 2021-03-07 MED ORDER — HYDRALAZINE HCL 20 MG/ML IJ SOLN
10.0000 mg | Freq: Four times a day (QID) | INTRAMUSCULAR | Status: DC | PRN
  Filled 2021-03-07: qty 1

## 2021-03-07 NOTE — Progress Notes (Signed)
Hypoglycemic Event  CBG: 46   Treatment: D50 25 mL (12.5 gm)  Symptoms: None  Follow-up CBG: Time:2207 CBG Result:112   Possible Reasons for Event: Inadequate meal intake  Comments/MD notified:n/a     [name]

## 2021-03-07 NOTE — Progress Notes (Signed)
PROGRESS NOTE    Katelyn Arroyo  GUY:[id number] DOB: 1928-07-17 DOA: 03/04/2021 PCP: [facility]  145A/145A-AA   Assessment & Plan:   Principal Problem:   Syncope Active Problems:   Essential hypertension   Dementia (HCC)   COPD (chronic obstructive pulmonary disease) (HCC)   Hyperlipidemia   Closed fracture of superior pubic ramus (HCC)   Closed sacral fracture (HCC)   Compression fracture of T2 vertebra (HCC)   COVID-19 virus infection   Insulin dependent type 2 diabetes mellitus ([place])   87 y/o F w/ PMH of dementia, insulin-dependent type 2 diabetes with admission for DKA earlier this year, hypothyroidism, depression who presented to the ED from SNF after a syncopal episode.  She was reportedly found unresponsive in her shower facedown by staff.  Patient is poor historian due to dementia and limited history was provided by SNF staff per EMS.  She was reportedly hypotensive with EMS and given half liter bolus IV fluids.   On admission was complaining of abdominal low back pain without radiation.     Patient was found to be positive for COVID-19 but denying respiratory symptoms or feeling sick.     Imaging studies revealed minimally displaced superior pubic ramus fracture and right sacral ala fracture.  Orthopedic surgery was consulted.     Syncopal episode  -etiology unclear given very limited history.  Differential includes hypoglycemia, vasovagal syncope, arrhythmia versus neurogenic.  May also have had a fall first followed by syncope. Head CT without acute intracranial findings. Neck CT without acute C-spine fracture. Pelvic and sacral fractures as below. Evaluation: Carotid ultrasound negative. Echo with EF greater than 56%, grade 1 diastolic dysfunction, mild LVH   Hyperkalemia -mild --resolved with Lokelma x1 --monitor   Right superior right ramus fracture Right sacral ala fracture  -likely sustained a fall during syncopal episode. --orthopedic  surgery consulted, rec Conservative management with pain control Plan: --PT/OT rec SNF  --Weightbearing as tolerated --Follow-up with Ortho at [facility] in 2 weeks  Insulin-dependent type 2 diabetes, poorly controlled -cont glargine 10u daily --SSI TID  COVID-19 infection, asymptomatic -- No COVID-specific treatment unless symptoms emerge --Airborne and contact cautions   CKD -likely stage IV, renal function at baseline unclear. --oral hydration  Hypothyroidism  - Continue Synthroid   Depression  -continue home Zoloft  Dementia  -continue Namenda and Aricept  Pulmonary nodule  -7 mm incidental finding on CT scan.  Repeat CT in 6 to 12 months per radiology guidelines.  Outpatient follow-up.   DVT prophylaxis: Heparin SQ Code Status: DNR  Family Communication:  Level of care: Med-Surg Dispo:   The patient is from: ALF Anticipated d/c is to: SNF Anticipated d/c date is: whenever bed available Patient currently is medically ready to d/c.   Subjective and Interval History:  Pain improved.  Ate ok.  No dyspnea or cough.   Objective: Vitals:   03/07/21 0749 03/07/21 1051 03/07/21 1128 03/07/21 1506  BP: (!) 181/84 (!) 195/83 (!) 115/42 (!) 103/48  Pulse: 80 83 90 85  Resp: 18  16 16   Temp: 97.6 F (36.4 C)  98.2 F (36.8 C) 97.8 F (36.6 C)  TempSrc: Oral     SpO2: 96%  99% 95%  Weight:      Height:        Intake/Output Summary (Last 24 hours) at 03/07/2021 1844 Last data filed at 03/07/2021 0603 Gross per 24 hour  Intake --  Output 500 ml  Net -500 ml   [employer]  03/04/21 1346 03/06/21 0500  Weight: 63.5 kg 66.3 kg    Examination:   Constitutional: NAD, alert, oriented to person and place HEENT: conjunctivae and lids normal, EOMI CV: No cyanosis.   RESP: normal respiratory effort, on RA SKIN: warm, dry Neuro: II - XII grossly intact.   Psych: Normal mood and affect.    Data Reviewed: I have personally reviewed following labs and imaging  studies  CBC: Recent Labs  Lab 03/04/21 1414 03/05/21 0657 03/06/21 0218 03/07/21 0504  WBC 7.1 7.9 6.1 6.6  NEUTROABS 5.0  --   --   --   HGB 11.2* 10.7* 9.9* 10.1*  HCT 33.6* 33.4* 29.4* 30.3*  MCV 88.0 90.8 88.3 85.6  PLT 244 213 197 867   Basic Metabolic Panel: Recent Labs  Lab 03/04/21 1414 03/05/21 0657 03/06/21 0218 03/07/21 0504  NA 137 132* 133* 136  K 4.5 5.2* 4.9 4.5  CL 101 97* 105 108  CO2 24 22 23 24   GLUCOSE 167* 363* 303* 141*  BUN 37* 39* 37* 29*  CREATININE 2.30* 2.40* 2.50* 2.18*  CALCIUM 8.4* 8.3* 7.9* 8.0*  MG 2.0  --  1.9 1.9   GFR: Estimated Creatinine Clearance: 16.4 mL/min (A) (by C-G formula based on SCr of 2.18 mg/dL (H)). Liver Function Tests: Recent Labs  Lab 03/04/21 1414  AST 17  ALT 13  ALKPHOS 80  BILITOT 0.9  PROT 5.9*  ALBUMIN 3.3*   No results for input(s): LIPASE, AMYLASE in the last 168 hours. No results for input(s): AMMONIA in the last 168 hours. Coagulation Profile: Recent Labs  Lab 03/04/21 1414  INR 0.9   Cardiac Enzymes: No results for input(s): CKTOTAL, CKMB, CKMBINDEX, TROPONINI in the last 168 hours. BNP (last 3 results) No results for input(s): PROBNP in the last 8760 hours. HbA1C: No results for input(s): HGBA1C in the last 72 hours. CBG: Recent Labs  Lab 03/06/21 2209 03/07/21 0802 03/07/21 1131 03/07/21 1508 03/07/21 1624  GLUCAP 112* 185* 296* 265* 280*   Lipid Profile: No results for input(s): CHOL, HDL, LDLCALC, TRIG, CHOLHDL, LDLDIRECT in the last 72 hours. Thyroid Function Tests: No results for input(s): TSH, T4TOTAL, FREET4, T3FREE, THYROIDAB in the last 72 hours. Anemia Panel: No results for input(s): VITAMINB12, FOLATE, FERRITIN, TIBC, IRON, RETICCTPCT in the last 72 hours. Sepsis Labs: Recent Labs  Lab 03/04/21 1414 03/04/21 1746  LATICACIDVEN 2.6* 1.5    Recent Results (from the past 240 hour(s))  Blood culture (routine single)     Status: None (Preliminary result)    Collection Time: 03/04/21  2:34 PM   Specimen: BLOOD  Result Value Ref Range Status   Specimen Description BLOOD BLOOD LEFT HAND  Final   Special Requests   Final    BOTTLES DRAWN AEROBIC AND ANAEROBIC Blood Culture adequate volume   Culture   Final    NO GROWTH 3 DAYS Performed at [facility], [address] [place], [place] [address]    Report Status PENDING  Incomplete  Resp Panel by RT-PCR (Flu A&B, Covid) Nasopharyngeal Swab     Status: Abnormal   Collection Time: 03/04/21  5:40 PM   Specimen: Nasopharyngeal Swab; Nasopharyngeal(NP) swabs in vial transport medium  Result Value Ref Range Status   SARS Coronavirus 2 by RT PCR POSITIVE (A) NEGATIVE Final    Comment: RESULT CALLED TO, READ BACK BY AND VERIFIED WITH: GIORGIA HAHLE AT 9326 ON 03/04/21 BY SS (NOTE) SARS-CoV-2 target nucleic acids are DETECTED.  The SARS-CoV-2  RNA is generally detectable in upper respiratory specimens during the acute phase of infection. Positive results are indicative of the presence of the identified virus, but do not rule out bacterial infection or co-infection with other pathogens not detected by the test. Clinical correlation with patient history and other diagnostic information is necessary to determine patient infection status. The expected result is Negative.  Fact Sheet for Patients: EntrepreneurPulse.com.au  Fact Sheet for Healthcare Providers: IncredibleEmployment.be  This test is not yet approved or cleared by the [place] FDA and  has been authorized for detection and/or diagnosis of SARS-CoV-2 by FDA under an Emergency Use Authorization (EUA).  This EUA will remain in effect (meaning this test  can be used) for the duration of  the COVID-19 declaration under Section 564(b)(1) of the Act, 21 U.S.C. section 360bbb-3(b)(1), unless the authorization is terminated or revoked sooner.     Influenza A by PCR NEGATIVE NEGATIVE Final    Influenza B by PCR NEGATIVE NEGATIVE Final    Comment: (NOTE) The Xpert Xpress SARS-CoV-2/FLU/RSV plus assay is intended as an aid in the diagnosis of influenza from Nasopharyngeal swab specimens and should not be used as a sole basis for treatment. Nasal washings and aspirates are unacceptable for Xpert Xpress SARS-CoV-2/FLU/RSV testing.  Fact Sheet for Patients: EntrepreneurPulse.com.au  Fact Sheet for Healthcare Providers: IncredibleEmployment.be  This test is not yet approved or cleared by the [place] FDA and has been authorized for detection and/or diagnosis of SARS-CoV-2 by FDA under an Emergency Use Authorization (EUA). This EUA will remain in effect (meaning this test can be used) for the duration of the COVID-19 declaration under Section 564(b)(1) of the Act, 21 U.S.C. section 360bbb-3(b)(1), unless the authorization is terminated or revoked.  Performed at [facility], [address] [place], [place] [address]   Urine Culture     Status: Abnormal   Collection Time: 03/04/21  5:40 PM   Specimen: In/Out Cath Urine  Result Value Ref Range Status   Specimen Description   Final    IN/OUT CATH URINE Performed at [facility], [address] [place], [place] [address]    Special Requests   Final    NONE Performed at [facility], [address] [place], [place] [address]    Culture (A)  Final    <10,000 COLONIES/mL INSIGNIFICANT GROWTH Performed at [facility], [address] [place], [place] [address]    Report Status 03/05/2021 FINAL  Final  MRSA Next Gen by PCR, Nasal     Status: None   Collection Time: 03/06/21  4:40 AM   Specimen: Nasal Mucosa; Nasal Swab  Result Value Ref Range Status   MRSA by PCR Next Gen NOT DETECTED NOT DETECTED Final    Comment: (NOTE) The GeneXpert MRSA Assay (FDA approved for NASAL specimens only), is one component of a comprehensive MRSA colonization  surveillance program. It is not intended to diagnose MRSA infection nor to guide or monitor treatment for MRSA infections. Test performance is not FDA approved in patients less than 63 years old. Performed at [facility], [address] [place], [place] [address]       Radiology Studies: No results found.   Scheduled Meds:  donepezil  10 mg Oral QHS   heparin  5,000 Units Subcutaneous Q8H   insulin aspart  0-9 Units Subcutaneous TID WC   insulin glargine-yfgn  10 Units Subcutaneous Daily   levothyroxine  125 mcg Oral Q0600   memantine  10 mg Oral BID   sertraline  25 mg Oral QHS   sodium chloride flush  3 mL Intravenous Q12H   Continuous Infusions:   LOS: 2 days     Enzo Bi, MD [facility] If 7PM-7AM, please contact night-coverage 03/07/2021, 6:44 PM

## 2021-03-07 NOTE — TOC Progression Note (Signed)
Transition of Care [facility]) - Progression Note    Patient Details  Name: Katelyn Arroyo MRN: [id number] Date of Birth: 1929-04-08  Transition of Care [facility]) CM/SW [place], RN Phone Number: 03/07/2021, 10:07 AM  Clinical Narrative:     No bed offers at this time, I reached out to the local SNF and requested to review for a bed offer, will review once obtained       Expected Discharge Plan and Services                                                 Social Determinants of Health (SDOH) Interventions    Readmission Risk Interventions No flowsheet data found.

## 2021-03-07 NOTE — Progress Notes (Signed)
Physical Therapy Treatment Patient Details Name: Katelyn Arroyo MRN: [id number] DOB: 1928/06/27 Today's Date: 03/07/2021   History of Present Illness Pt is an 87 y/o F with PMH: IDDM, hypothyroidism, dementia, and depression. Pt presents from facility [facility]) after fall/syncopal episode, found to have minimally displaced right superior pubic ramus fracture and right sacral ala fracture.    PT Comments    Pt ready for session.  Participated in exercises as described below.  Guarded movement RLE.  To EOB with mod a x 1.  Steady in sitting.  She is able to stand fully today but with flexed posture and heavy reliance on walker she stands briefly x 2 with continuous mod a x 1 to prevent post fall back on bed.  She is unable to take any sidesteps or safely transfer to recliner at bedside as legs begin to quake and she sits quickly on bed.  Returned to supine with mod/max a 1 and repositioned in bed for lunch.   Recommendations for follow up therapy are one component of a multi-disciplinary discharge planning process, led by the attending physician.  Recommendations may be updated based on patient status, additional functional criteria and insurance authorization.  Follow Up Recommendations  Skilled nursing-short term rehab (<3 hours/day)     Assistance Recommended at Discharge Frequent or constant Supervision/Assistance  Equipment Recommendations       Recommendations for Other Services       Precautions / Restrictions Precautions Precautions: Fall Restrictions Weight Bearing Restrictions: Yes RLE Weight Bearing: Weight bearing as tolerated     Mobility  Bed Mobility Overal bed mobility: Needs Assistance Bed Mobility: Supine to Sit;Sit to Supine     Supine to sit: Mod assist Sit to supine: Max assist        Transfers Overall transfer level: Needs assistance Equipment used: Rolling walker (2 wheels) Transfers: Sit to/from Stand Sit to Stand: Mod assist                 Ambulation/Gait             General Gait Details: unable   Stairs             Wheelchair Mobility    Modified Rankin (Stroke Patients Only)       Balance Overall balance assessment: Needs assistance Sitting-balance support: Feet supported Sitting balance-Leahy Scale: Fair Sitting balance - Comments: able to sit unsupported with supervision today   Standing balance support: Bilateral upper extremity supported Standing balance-Leahy Scale: Poor Standing balance comment: hand on assist at all times                            Cognition Arousal/Alertness: Awake/alert Behavior During Therapy: WFL for tasks assessed/performed Overall Cognitive Status: History of cognitive impairments - at baseline                                          Exercises Other Exercises Other Exercises: BLE AAROM x 10.  guareded with ROM RLE    General Comments        Pertinent Vitals/Pain Pain Assessment: Faces Faces Pain Scale: Hurts little more Pain Location: with movement.  R hip Pain Descriptors / Indicators: Sore;Guarding Pain Intervention(s): Limited activity within patient's tolerance;Repositioned    Home Living  Prior Function            PT Goals (current goals can now be found in the care plan section) Progress towards PT goals: Progressing toward goals    Frequency    Min 2X/week      PT Plan Current plan remains appropriate    Co-evaluation              AM-PAC PT "6 Clicks" Mobility   Outcome Measure  Help needed turning from your back to your side while in a flat bed without using bedrails?: A Little Help needed moving from lying on your back to sitting on the side of a flat bed without using bedrails?: A Lot Help needed moving to and from a bed to a chair (including a wheelchair)?: Total Help needed standing up from a chair using your arms (e.g., wheelchair or bedside chair)?:  A Lot Help needed to walk in hospital room?: Total Help needed climbing 3-5 steps with a railing? : Total 6 Click Score: 10    End of Session Equipment Utilized During Treatment: Gait belt Activity Tolerance: Patient tolerated treatment well Patient left: with bed alarm set;with call bell/phone within reach;in bed Nurse Communication: Mobility status PT Visit Diagnosis: History of falling (Z91.81);Muscle weakness (generalized) (M62.81);Difficulty in walking, not elsewhere classified (R26.2);Pain Pain - Right/Left: Right Pain - part of body: Hip     Time: 0104-0127 PT Time Calculation (min) (ACUTE ONLY): 23 min  Charges:  $Therapeutic Exercise: 8-22 mins $Therapeutic Activity: 8-22 mins                    Chesley Noon, PTA 03/07/21, 2:13 PM

## 2021-03-07 NOTE — TOC Progression Note (Signed)
Transition of Care [facility]) - Progression Note    Patient Details  Name: Katelyn Arroyo MRN: [id number] Date of Birth: [birth year]  Transition [facility] Care [facility]) CM/SW [place], RN Phone Number: 03/07/2021, 10:05 AM  Clinical Narrative:     Obtained PASSR number [id number]       Expected Discharge Plan and Services                                                 Social Determinants of Health (SDOH) Interventions    Readmission Risk Interventions No flowsheet data found.

## 2021-03-08 DIAGNOSIS — R55 Syncope and collapse: Secondary | ICD-10-CM | POA: Diagnosis not present

## 2021-03-08 LAB — CBC
HCT: 29.3 % — ABNORMAL LOW (ref 36.0–46.0)
Hemoglobin: 9.9 g/dL — ABNORMAL LOW (ref 12.0–15.0)
MCH: 29 pg (ref 26.0–34.0)
MCHC: 33.8 g/dL (ref 30.0–36.0)
MCV: 85.9 fL (ref 80.0–100.0)
Platelets: 206 10*3/uL (ref 150–400)
RBC: 3.41 MIL/uL — ABNORMAL LOW (ref 3.87–5.11)
RDW: 13.7 % (ref 11.5–15.5)
WBC: 6.7 10*3/uL (ref 4.0–10.5)
nRBC: 0 % (ref 0.0–0.2)

## 2021-03-08 LAB — GLUCOSE, CAPILLARY
Glucose-Capillary: 155 mg/dL — ABNORMAL HIGH (ref 70–99)
Glucose-Capillary: 190 mg/dL — ABNORMAL HIGH (ref 70–99)
Glucose-Capillary: 215 mg/dL — ABNORMAL HIGH (ref 70–99)
Glucose-Capillary: 247 mg/dL — ABNORMAL HIGH (ref 70–99)

## 2021-03-08 LAB — MAGNESIUM: Magnesium: 2 mg/dL (ref 1.7–2.4)

## 2021-03-08 LAB — BASIC METABOLIC PANEL
Anion gap: 7 (ref 5–15)
BUN: 37 mg/dL — ABNORMAL HIGH (ref 8–23)
CO2: 24 mmol/L (ref 22–32)
Calcium: 8 mg/dL — ABNORMAL LOW (ref 8.9–10.3)
Chloride: 102 mmol/L (ref 98–111)
Creatinine, Ser: 2.57 mg/dL — ABNORMAL HIGH (ref 0.44–1.00)
GFR, Estimated: 17 mL/min — ABNORMAL LOW (ref >60)
Glucose, Bld: 195 mg/dL — ABNORMAL HIGH (ref 70–99)
Potassium: 4.8 mmol/L (ref 3.5–5.1)
Sodium: 133 mmol/L — ABNORMAL LOW (ref 135–145)

## 2021-03-08 MED ORDER — INSULIN ASPART 100 UNIT/ML IJ SOLN
5.0000 [IU] | Freq: Three times a day (TID) | INTRAMUSCULAR | Status: DC
  Administered 2021-03-09 – 2021-03-10 (×4): 5 [IU] via SUBCUTANEOUS
  Filled 2021-03-08 (×4): qty 1

## 2021-03-08 MED ORDER — POLYETHYLENE GLYCOL 3350 17 G PO PACK
17.0000 g | PACK | Freq: Two times a day (BID) | ORAL | Status: DC
  Administered 2021-03-08 – 2021-03-15 (×16): 17 g via ORAL
  Filled 2021-03-08 (×17): qty 1

## 2021-03-08 NOTE — TOC Progression Note (Signed)
Transition of Care [facility]) - Progression Note    Patient Details  Name: Katelyn Arroyo MRN: [id number] Date of Birth: 08/03/1928  Transition of Care [facility]) CM/SW [place], RN Phone Number: 03/08/2021, 10:06 AM  Clinical Narrative:     Due to positive covid test unable to go to [facility] SNF until 11/4, will review the bed offers prior to and get patient choice, Only 1 bed offer at this time       Expected Discharge Plan and Services                                                 Social Determinants of Health (SDOH) Interventions    Readmission Risk Interventions No flowsheet data found.

## 2021-03-08 NOTE — TOC Progression Note (Signed)
Transition of Care [facility]) - Progression Note    Patient Details  Name: Katelyn Arroyo MRN: [id number] Date of Birth: 1929-01-23  Transition of Care [facility]) CM/SW [place], RN Phone Number: 03/08/2021, 2:49 PM  Clinical Narrative:   Spoke to the patient's daughter Katelyn Arroyo on the phone, She stated that she has arranged for the patient to go to a SNF in [place] with [facility]. The name of the facillity is [facility]  She will be transported by [facility] [place].  The fax number to send the referral is [phone], I faxed the FL2, Progress notes and Face sheet. The daughter stated that the facility would not be able to accept her until 10 days past the covid pos date, The date planned to DC is 03/15/21 Katelyn Arroyo is arranging the transport.  I provided my contact information. TOC to continue to monitor for needs         Expected Discharge Plan and Services                                                 Social Determinants of Health (SDOH) Interventions    Readmission Risk Interventions No flowsheet data found.

## 2021-03-08 NOTE — Progress Notes (Signed)
Occupational Therapy Treatment Patient Details Name: Katelyn Arroyo MRN: [id number] DOB: March 21, 1929 Today's Date: 03/08/2021   History of present illness Pt is an 87 y/o F with PMH: IDDM, hypothyroidism, dementia, and depression. Pt presents from facility [facility]) after fall/syncopal episode, found to have minimally displaced right superior pubic ramus fracture and right sacral ala fracture.   OT comments  Pt seen for OT tx this date. Pt pleasantly confused, oriented to self only. She does ask what day it is at start and end of session. Able to follow simple commands fairly well. Visually does not appear in pain when at rest both in supine and seated EOB and denies pain when asked. However, during transitional movements, pt noted with significant R hip pain. Pt required MOD-MAX A for bed mobility, supervision + set up for seated grooming tasks. Orthostatics attempted: supine HR 83, 135/57;  sitting HR 86, 130/69;  stand 33min - unable to tolerate;    sitting HR 91, BP 113/58;   supine HR 86, BP 115/66. Pt unable to verbalize symptoms but did indicate with standing and sitting near end of session that she had to "sit down" and "lay back." RN notified. Pt progressing slowly towards goals, but is making progress.    Recommendations for follow up therapy are one component of a multi-disciplinary discharge planning process, led by the attending physician.  Recommendations may be updated based on patient status, additional functional criteria and insurance authorization.    Follow Up Recommendations  Skilled nursing-short term rehab (<3 hours/day)    Assistance Recommended at Discharge Frequent or constant Supervision/Assistance  Equipment Recommendations  [facility]    Recommendations for Other Services      Precautions / Restrictions Precautions Precautions: Fall Restrictions Weight Bearing Restrictions: Yes RLE Weight Bearing: Weight bearing as tolerated       Mobility Bed Mobility Overal bed  mobility: Needs Assistance Bed Mobility: Supine to Sit;Sit to Supine     Supine to sit: Mod assist;Max assist;HOB elevated Sit to supine: Max assist   General bed mobility comments: cues for sequencing, but ultimately requiring significant assist, pain limited with movement, but then improved once still    Transfers Overall transfer level: Needs assistance Equipment used: Rolling walker (2 wheels) Transfers: Sit to/from Stand Sit to Stand: Max assist           General transfer comment: unable to tolerate standing long enough to get standing BP     Balance Overall balance assessment: Needs assistance Sitting-balance support: Feet supported Sitting balance-Leahy Scale: Fair Sitting balance - Comments: able to sit unsupported with supervision during grooming tasks   Standing balance support: Bilateral upper extremity supported Standing balance-Leahy Scale: Poor                             ADL either performed or assessed with clinical judgement   ADL Overall ADL's : Needs assistance/impaired     Grooming: Sitting;Set up;Supervision/safety;Oral care                                       Vision       Perception     Praxis      Cognition Arousal/Alertness: Awake/alert Behavior During Therapy: WFL for tasks assessed/performed Overall Cognitive Status: History of cognitive impairments - at baseline  General Comments: Pt is A&O to self only. Follows simple commands with VC. Pleasant.          Exercises Other Exercises Other Exercises: ADL transfers, RW mgt, bed mobility, seated grooming tasks   Shoulder Instructions       General Comments Orthostatics attempted: supine HR 83, 135/57;  sitting HR 86, 130/69;  stand 45min - unable to tolerate;    sitting HR 91, BP 113/58;   supine HR 86, BP 115/66. Pt unable to verbalize symptoms but did indicate with standing and sitting near end of  session that she had to "sit down" and "lay back." RN notified.    Pertinent Vitals/ Pain       Pain Assessment: Faces Faces Pain Scale: Hurts even more Pain Location: with movement.  R hip Pain Descriptors / Indicators: Sore;Guarding;Grimacing;Moaning Pain Intervention(s): Limited activity within patient's tolerance;Monitored during session;Repositioned;Patient requesting pain meds-RN notified  Home Living                                          Prior Functioning/Environment              Frequency  Min 1X/week        Progress Toward Goals  OT Goals(current goals can now be found in the care plan section)  Progress towards OT goals: Progressing toward goals  Acute Rehab OT Goals Patient Stated Goal: pt does not state today OT Goal Formulation: With patient Time For Goal Achievement: 03/20/21 Potential to Achieve Goals: [place] Discharge plan remains appropriate;Frequency remains appropriate    Co-evaluation                 AM-PAC OT "6 Clicks" Daily Activity     Outcome Measure   Help from another person eating meals?: A Little Help from another person taking care of personal grooming?: A Little Help from another person toileting, which includes using toliet, bedpan, or urinal?: A Lot Help from another person bathing (including washing, rinsing, drying)?: A Lot Help from another person to put on and taking off regular upper body clothing?: A Little Help from another person to put on and taking off regular lower body clothing?: A Lot 6 Click Score: 15    End of Session Equipment Utilized During Treatment: Gait belt;Rolling walker (2 wheels)  OT Visit Diagnosis: Other abnormalities of gait and mobility (R26.89);Pain Pain - Right/Left: Right Pain - part of body: Hip   Activity Tolerance Patient limited by pain   Patient Left in bed;with call bell/phone within reach;with bed alarm set   Nurse Communication Patient requests pain  meds;Mobility status        Time: 0932-3557 OT Time Calculation (min): 32 min  Charges: OT General Charges $OT Visit: 1 Visit OT Treatments $Self Care/Home Management : 23-37 mins  Ardeth Perfect., MPH, MS, OTR/L ascom [phone] 03/08/21, 10:59 AM

## 2021-03-08 NOTE — Progress Notes (Signed)
Inpatient Diabetes Program Recommendations  AACE/ADA: New Consensus Statement on Inpatient Glycemic Control   Target Ranges:  Prepandial:   less than 140 mg/dL      Peak postprandial:   less than 180 mg/dL (1-2 hours)      Critically ill patients:  140 - 180 mg/dL   Results for NAVYA, TIMMONS (MRN [id number]) as of 03/08/2021 12:02  Ref. Range 03/07/2021 08:02 03/07/2021 11:31 03/07/2021 15:08 03/07/2021 16:24 03/07/2021 23:45 03/08/2021 08:43  Glucose-Capillary Latest Ref Range: 70 - 99 mg/dL 185 (H) 296 (H) 265 (H) 280 (H) 150 (H) 215 (H)   Review of Glycemic Control Diabetes history: DM Outpatient Diabetes medications: Lantus 10 units daily, Novolog 4-10 units TID with meals Current orders for Inpatient glycemic control: Semglee 10 units daily, Novolog 0-9 units TID with meals   Inpatient Diabetes Program Recommendations:    Insulin: Please consider ordering Novolog 3 units TID with meals for meal coverage if patient eats at least 50% of meals.  Thanks, Barnie Alderman, RN, MSN, CDE Diabetes Coordinator Inpatient Diabetes Program [phone] (Team Pager from 8am to 5pm)

## 2021-03-08 NOTE — Progress Notes (Signed)
PROGRESS NOTE    Katelyn Arroyo  LFY:[id number] DOB: 1928-12-20 DOA: 03/04/2021 PCP: [facility]  145A/145A-AA   Assessment & Plan:   Principal Problem:   Syncope Active Problems:   Essential hypertension   Dementia (HCC)   COPD (chronic obstructive pulmonary disease) (HCC)   Hyperlipidemia   Closed fracture of superior pubic ramus (HCC)   Closed sacral fracture (HCC)   Compression fracture of T2 vertebra (HCC)   COVID-19 virus infection   Insulin dependent type 2 diabetes mellitus ([place])   87 y/o F w/ PMH of dementia, insulin-dependent type 2 diabetes with admission for DKA earlier this year, hypothyroidism, depression who presented to the ED from SNF after a syncopal episode.  She was reportedly found unresponsive in her shower facedown by staff.  Patient is poor historian due to dementia and limited history was provided by SNF staff per EMS.  She was reportedly hypotensive with EMS and given half liter bolus IV fluids.   On admission was complaining of abdominal low back pain without radiation.     Patient was found to be positive for COVID-19 but denying respiratory symptoms or feeling sick.     Imaging studies revealed minimally displaced superior pubic ramus fracture and right sacral ala fracture.  Orthopedic surgery was consulted.     Syncopal episode  -etiology unclear given very limited history.  Differential includes hypoglycemia, vasovagal syncope, arrhythmia versus neurogenic.  May also have had a fall first followed by syncope. Head CT without acute intracranial findings. Neck CT without acute C-spine fracture. Pelvic and sacral fractures as below. Evaluation: Carotid ultrasound negative. Echo with EF greater than 85%, grade 1 diastolic dysfunction, mild LVH   Hyperkalemia -mild --resolved with Lokelma x1 --monitor   Right superior right ramus fracture Right sacral ala fracture  -likely sustained during the fall --orthopedic surgery consulted,  rec Conservative management with pain control Plan: --PT/OT rec SNF  --Weightbearing as tolerated --Follow-up with Ortho at [facility] in 2 weeks  Insulin-dependent type 2 diabetes, poorly controlled --A1c 9.1 --cont glargine 10u daily --SSI TID --add mealtime 5u TID  COVID-19 infection, asymptomatic -- No COVID-specific treatment unless symptoms emerge --need 10-day isolation before going to SNF, will be out of isolation on 11/4   CKD -likely stage IV renal function at baseline unclear. --oral hydration  Hypothyroidism  - Continue Synthroid   Depression  -continue home Zoloft  Dementia  -continue Namenda and Aricept  Pulmonary nodule  -7 mm incidental finding on CT scan.  Repeat CT in 6 to 12 months per radiology guidelines.  Outpatient follow-up.   DVT prophylaxis: Heparin SQ Code Status: DNR  Family Communication:  Level of care: Med-Surg Dispo:   The patient is from: ALF Anticipated d/c is to: daughter is arranging for medical transport to take pt to a facility in [place] when pt is out of isolation Anticipated d/c date is: 11/4  Patient currently is medically ready to d/c.   Subjective and Interval History:  Still have pain in her pelvis with movement, though improved.  Reported good oral intake and hydration.     Objective: Vitals:   03/07/21 1128 03/07/21 1506 03/08/21 0600 03/08/21 0854  BP: (!) 115/42 (!) 103/48 (!) 144/76 (!) 160/77  Pulse: 90 85 80 85  Resp: 16 16 18 16   Temp: 98.2 F (36.8 C) 97.8 F (36.6 C) 98.2 F (36.8 C) 98.5 F (36.9 C)  TempSrc:    Oral  SpO2: 99% 95% 97% 96%  Weight:  Height:        Intake/Output Summary (Last 24 hours) at 03/08/2021 1435 Last data filed at 03/08/2021 1202 Gross per 24 hour  Intake 123 ml  Output 1025 ml  Net -902 ml   Filed Weights   03/04/21 1346 03/06/21 0500  Weight: 63.5 kg 66.3 kg    Examination:   Constitutional: NAD, AAOx3 HEENT: conjunctivae and lids normal, EOMI CV: No  cyanosis.   RESP: normal respiratory effort, on RA SKIN: warm, dry Neuro: II - XII grossly intact.   Psych: Normal mood and affect.  Appropriate judgement and reason   Data Reviewed: I have personally reviewed following labs and imaging studies  CBC: Recent Labs  Lab 03/04/21 1414 03/05/21 0657 03/06/21 0218 03/07/21 0504 03/08/21 0536  WBC 7.1 7.9 6.1 6.6 6.7  NEUTROABS 5.0  --   --   --   --   HGB 11.2* 10.7* 9.9* 10.1* 9.9*  HCT 33.6* 33.4* 29.4* 30.3* 29.3*  MCV 88.0 90.8 88.3 85.6 85.9  PLT 244 213 197 197 960   Basic Metabolic Panel: Recent Labs  Lab 03/04/21 1414 03/05/21 0657 03/06/21 0218 03/07/21 0504 03/08/21 0536  NA 137 132* 133* 136 133*  K 4.5 5.2* 4.9 4.5 4.8  CL 101 97* 105 108 102  CO2 24 22 23 24 24   GLUCOSE 167* 363* 303* 141* 195*  BUN 37* 39* 37* 29* 37*  CREATININE 2.30* 2.40* 2.50* 2.18* 2.57*  CALCIUM 8.4* 8.3* 7.9* 8.0* 8.0*  MG 2.0  --  1.9 1.9 2.0   GFR: Estimated Creatinine Clearance: 13.9 mL/min (A) (by C-G formula based on SCr of 2.57 mg/dL (H)). Liver Function Tests: Recent Labs  Lab 03/04/21 1414  AST 17  ALT 13  ALKPHOS 80  BILITOT 0.9  PROT 5.9*  ALBUMIN 3.3*   No results for input(s): LIPASE, AMYLASE in the last 168 hours. No results for input(s): AMMONIA in the last 168 hours. Coagulation Profile: Recent Labs  Lab 03/04/21 1414  INR 0.9   Cardiac Enzymes: No results for input(s): CKTOTAL, CKMB, CKMBINDEX, TROPONINI in the last 168 hours. BNP (last 3 results) No results for input(s): PROBNP in the last 8760 hours. HbA1C: No results for input(s): HGBA1C in the last 72 hours. CBG: Recent Labs  Lab 03/07/21 1508 03/07/21 1624 03/07/21 2345 03/08/21 0843 03/08/21 1230  GLUCAP 265* 280* 150* 215* 247*   Lipid Profile: No results for input(s): CHOL, HDL, LDLCALC, TRIG, CHOLHDL, LDLDIRECT in the last 72 hours. Thyroid Function Tests: No results for input(s): TSH, T4TOTAL, FREET4, T3FREE, THYROIDAB in the  last 72 hours. Anemia Panel: No results for input(s): VITAMINB12, FOLATE, FERRITIN, TIBC, IRON, RETICCTPCT in the last 72 hours. Sepsis Labs: Recent Labs  Lab 03/04/21 1414 03/04/21 1746  LATICACIDVEN 2.6* 1.5    Recent Results (from the past 240 hour(s))  Blood culture (routine single)     Status: None (Preliminary result)   Collection Time: 03/04/21  2:34 PM   Specimen: BLOOD  Result Value Ref Range Status   Specimen Description BLOOD BLOOD LEFT HAND  Final   Special Requests   Final    BOTTLES DRAWN AEROBIC AND ANAEROBIC Blood Culture adequate volume   Culture   Final    NO GROWTH 4 DAYS Performed at [facility], [address] [place], [place] [address]    Report Status PENDING  Incomplete  Resp Panel by RT-PCR (Flu A&B, Covid) Nasopharyngeal Swab     Status: Abnormal   Collection Time:  03/04/21  5:40 PM   Specimen: Nasopharyngeal Swab; Nasopharyngeal(NP) swabs in vial transport medium  Result Value Ref Range Status   SARS Coronavirus 2 by RT PCR POSITIVE (A) NEGATIVE Final    Comment: RESULT CALLED TO, READ BACK BY AND VERIFIED WITH: GIORGIA HAHLE AT 6010 ON 03/04/21 BY SS (NOTE) SARS-CoV-2 target nucleic acids are DETECTED.  The SARS-CoV-2 RNA is generally detectable in upper respiratory specimens during the acute phase of infection. Positive results are indicative of the presence of the identified virus, but do not rule out bacterial infection or co-infection with other pathogens not detected by the test. Clinical correlation with patient history and other diagnostic information is necessary to determine patient infection status. The expected result is Negative.  Fact Sheet for Patients: EntrepreneurPulse.com.au  Fact Sheet for Healthcare Providers: IncredibleEmployment.be  This test is not yet approved or cleared by the [place] FDA and  has been authorized for detection and/or diagnosis of SARS-CoV-2 by FDA  under an Emergency Use Authorization (EUA).  This EUA will remain in effect (meaning this test  can be used) for the duration of  the COVID-19 declaration under Section 564(b)(1) of the Act, 21 U.S.C. section 360bbb-3(b)(1), unless the authorization is terminated or revoked sooner.     Influenza A by PCR NEGATIVE NEGATIVE Final   Influenza B by PCR NEGATIVE NEGATIVE Final    Comment: (NOTE) The Xpert Xpress SARS-CoV-2/FLU/RSV plus assay is intended as an aid in the diagnosis of influenza from Nasopharyngeal swab specimens and should not be used as a sole basis for treatment. Nasal washings and aspirates are unacceptable for Xpert Xpress SARS-CoV-2/FLU/RSV testing.  Fact Sheet for Patients: EntrepreneurPulse.com.au  Fact Sheet for Healthcare Providers: IncredibleEmployment.be  This test is not yet approved or cleared by the [place] FDA and has been authorized for detection and/or diagnosis of SARS-CoV-2 by FDA under an Emergency Use Authorization (EUA). This EUA will remain in effect (meaning this test can be used) for the duration of the COVID-19 declaration under Section 564(b)(1) of the Act, 21 U.S.C. section 360bbb-3(b)(1), unless the authorization is terminated or revoked.  Performed at [facility], [address], [place], [place] [address]   Urine Culture     Status: Abnormal   Collection Time: 03/04/21  5:40 PM   Specimen: In/Out Cath Urine  Result Value Ref Range Status   Specimen Description   Final    IN/OUT CATH URINE Performed at [facility], [address], [place], [place] [address]    Special Requests   Final    NONE Performed at [facility], [address] [place], [place] [address]    Culture (A)  Final    <10,000 COLONIES/mL INSIGNIFICANT GROWTH Performed at [facility], [place] [address] [place], [place] [address]    Report Status 03/05/2021 FINAL  Final  MRSA Next Gen  by PCR, Nasal     Status: None   Collection Time: 03/06/21  4:40 AM   Specimen: Nasal Mucosa; Nasal Swab  Result Value Ref Range Status   MRSA by PCR Next Gen NOT DETECTED NOT DETECTED Final    Comment: (NOTE) The GeneXpert MRSA Assay (FDA approved for NASAL specimens only), is one component of a comprehensive MRSA colonization surveillance program. It is not intended to diagnose MRSA infection nor to guide or monitor treatment for MRSA infections. Test performance is not FDA approved in patients less than 22 years old. Performed at [facility], [address] [place], [place] [address]       Radiology Studies: No results found.   Scheduled Meds:  donepezil  10 mg Oral QHS   heparin  5,000 Units Subcutaneous Q8H   insulin aspart  0-9 Units Subcutaneous TID WC   insulin glargine-yfgn  10 Units Subcutaneous Daily   levothyroxine  125 mcg Oral Q0600   memantine  10 mg Oral BID   polyethylene glycol  17 g Oral BID   sertraline  25 mg Oral QHS   sodium chloride flush  3 mL Intravenous Q12H   Continuous Infusions:   LOS: 3 days     Enzo Bi, MD [facility] If 7PM-7AM, please contact night-coverage 03/08/2021, 2:35 PM

## 2021-03-09 DIAGNOSIS — S32511A Fracture of superior rim of right pubis, initial encounter for closed fracture: Secondary | ICD-10-CM

## 2021-03-09 LAB — CBC
HCT: 29.1 % — ABNORMAL LOW (ref 36.0–46.0)
Hemoglobin: 9.9 g/dL — ABNORMAL LOW (ref 12.0–15.0)
MCH: 29.6 pg (ref 26.0–34.0)
MCHC: 34 g/dL (ref 30.0–36.0)
MCV: 87.1 fL (ref 80.0–100.0)
Platelets: 217 10*3/uL (ref 150–400)
RBC: 3.34 MIL/uL — ABNORMAL LOW (ref 3.87–5.11)
RDW: 13.4 % (ref 11.5–15.5)
WBC: 7.3 10*3/uL (ref 4.0–10.5)
nRBC: 0 % (ref 0.0–0.2)

## 2021-03-09 LAB — BASIC METABOLIC PANEL
Anion gap: 9 (ref 5–15)
BUN: 36 mg/dL — ABNORMAL HIGH (ref 8–23)
CO2: 24 mmol/L (ref 22–32)
Calcium: 8.3 mg/dL — ABNORMAL LOW (ref 8.9–10.3)
Chloride: 100 mmol/L (ref 98–111)
Creatinine, Ser: 2.25 mg/dL — ABNORMAL HIGH (ref 0.44–1.00)
GFR, Estimated: 20 mL/min — ABNORMAL LOW (ref >60)
Glucose, Bld: 242 mg/dL — ABNORMAL HIGH (ref 70–99)
Potassium: 4.7 mmol/L (ref 3.5–5.1)
Sodium: 133 mmol/L — ABNORMAL LOW (ref 135–145)

## 2021-03-09 LAB — GLUCOSE, CAPILLARY
Glucose-Capillary: 313 mg/dL — ABNORMAL HIGH (ref 70–99)
Glucose-Capillary: 104 mg/dL — ABNORMAL HIGH (ref 70–99)
Glucose-Capillary: 243 mg/dL — ABNORMAL HIGH (ref 70–99)
Glucose-Capillary: 184 mg/dL — ABNORMAL HIGH (ref 70–99)

## 2021-03-09 LAB — CULTURE, BLOOD (SINGLE)
Culture: NO GROWTH
Special Requests: ADEQUATE

## 2021-03-09 LAB — MAGNESIUM: Magnesium: 1.9 mg/dL (ref 1.7–2.4)

## 2021-03-09 MED ORDER — INSULIN GLARGINE-YFGN 100 UNIT/ML ~~LOC~~ SOLN
5.0000 [IU] | Freq: Once | SUBCUTANEOUS | Status: AC
  Administered 2021-03-09: 5 [IU] via SUBCUTANEOUS
  Filled 2021-03-09: qty 0.05

## 2021-03-09 MED ORDER — INSULIN GLARGINE-YFGN 100 UNIT/ML ~~LOC~~ SOLN
15.0000 [IU] | Freq: Every day | SUBCUTANEOUS | Status: DC
  Filled 2021-03-09: qty 0.15

## 2021-03-09 MED ORDER — METOPROLOL TARTRATE 50 MG PO TABS
50.0000 mg | ORAL_TABLET | Freq: Two times a day (BID) | ORAL | Status: DC
  Administered 2021-03-09 – 2021-03-15 (×13): 50 mg via ORAL
  Filled 2021-03-09 (×15): qty 1

## 2021-03-09 NOTE — Progress Notes (Signed)
PROGRESS NOTE    Katelyn Arroyo  QPY:[id number] DOB: 03-08-1929 DOA: 03/04/2021 PCP: [facility]  145A/145A-AA   Assessment & Plan:   Principal Problem:   Syncope Active Problems:   Essential hypertension   Dementia (HCC)   COPD (chronic obstructive pulmonary disease) (HCC)   Hyperlipidemia   Closed fracture of superior pubic ramus (HCC)   Closed sacral fracture (HCC)   Compression fracture of T2 vertebra (HCC)   COVID-19 virus infection   Insulin dependent type 2 diabetes mellitus ([place])   87 y/o F w/ PMH of dementia, insulin-dependent type 2 diabetes with admission for DKA earlier this year, hypothyroidism, depression who presented to the ED from SNF after a syncopal episode.  She was reportedly found unresponsive in her shower facedown by staff.  Patient is poor historian due to dementia and limited history was provided by SNF staff per EMS.  She was reportedly hypotensive with EMS and given half liter bolus IV fluids.   On admission was complaining of abdominal low back pain without radiation.     Patient was found to be positive for COVID-19 but denying respiratory symptoms or feeling sick.     Imaging studies revealed minimally displaced superior pubic ramus fracture and right sacral ala fracture.  Orthopedic surgery was consulted.     Syncopal episode  -etiology unclear given very limited history.  Differential includes hypoglycemia, vasovagal syncope, arrhythmia versus neurogenic.  May also have had a fall first followed by syncope. Head CT without acute intracranial findings. Neck CT without acute C-spine fracture. Pelvic and sacral fractures as below. Evaluation: Carotid ultrasound negative. Echo with EF greater than 12%, grade 1 diastolic dysfunction, mild LVH   Hyperkalemia -resolved --resolved with Lokelma x1 --monitor   Right superior right ramus fracture Right sacral ala fracture  -likely sustained during the fall --orthopedic surgery  consulted, rec Conservative management with pain control Plan: --PT/OT rec SNF  --Weightbearing as tolerated --will need to follow up with ortho in [place]  Insulin-dependent type 2 diabetes, poorly controlled --A1c 9.1 --increase glargine to 15u daily --cont mealtime 5u TID --SSI TID  COVID-19 infection, asymptomatic -- No COVID-specific treatment unless symptoms emerge --need 10-day isolation before going to SNF, will be out of isolation on 11/4   CKD -likely stage IV renal function at baseline unclear. --oral hydration  Hypothyroidism  - Continue Synthroid   Depression  -continue home Zoloft  Dementia  -continue Namenda and Aricept  Pulmonary nodule  -7 mm incidental finding on CT scan.  Repeat CT in 6 to 12 months per radiology guidelines.  Outpatient follow-up.   DVT prophylaxis: Heparin SQ Code Status: DNR  Family Communication:  Level of care: Med-Surg Dispo:   The patient is from: ALF Anticipated d/c is to: daughter is arranging for medical transport to take pt to a facility in [place] when pt is out of isolation Anticipated d/c date is: 11/4  Patient currently is medically ready to d/c.   Subjective and Interval History:  Eating ok.  Pain continues to improve.  Pt is aware now that she is going back to [place], but couldn't tell me why she came to [facility].   Objective: Vitals:   03/09/21 0531 03/09/21 0559 03/09/21 0840 03/09/21 1248  BP: (!) 164/75  (!) 179/79 124/70  Pulse: 85  93 86  Resp: 18  16 17   Temp: 97.8 F (36.6 C)  98 F (36.7 C) 98.4 F (36.9 C)  TempSrc:      SpO2: 95%  96% 100%  Weight:  63.6 kg    Height:        Intake/Output Summary (Last 24 hours) at 03/09/2021 1500 Last data filed at 03/09/2021 7124 Gross per 24 hour  Intake 10 ml  Output 1200 ml  Net -1190 ml   Filed Weights   03/04/21 1346 03/06/21 0500 03/09/21 0559  Weight: 63.5 kg 66.3 kg 63.6 kg    Examination:   Constitutional: NAD, alert, oriented to person  and place HEENT: conjunctivae and lids normal, EOMI CV: No cyanosis.   RESP: normal respiratory effort, on RA SKIN: warm, dry Neuro: II - XII grossly intact.     Data Reviewed: I have personally reviewed following labs and imaging studies  CBC: Recent Labs  Lab 03/04/21 1414 03/05/21 0657 03/06/21 0218 03/07/21 0504 03/08/21 0536 03/09/21 0506  WBC 7.1 7.9 6.1 6.6 6.7 7.3  NEUTROABS 5.0  --   --   --   --   --   HGB 11.2* 10.7* 9.9* 10.1* 9.9* 9.9*  HCT 33.6* 33.4* 29.4* 30.3* 29.3* 29.1*  MCV 88.0 90.8 88.3 85.6 85.9 87.1  PLT 244 213 197 197 206 580   Basic Metabolic Panel: Recent Labs  Lab 03/04/21 1414 03/05/21 0657 03/06/21 0218 03/07/21 0504 03/08/21 0536 03/09/21 0506  NA 137 132* 133* 136 133* 133*  K 4.5 5.2* 4.9 4.5 4.8 4.7  CL 101 97* 105 108 102 100  CO2 24 22 23 24 24 24   GLUCOSE 167* 363* 303* 141* 195* 242*  BUN 37* 39* 37* 29* 37* 36*  CREATININE 2.30* 2.40* 2.50* 2.18* 2.57* 2.25*  CALCIUM 8.4* 8.3* 7.9* 8.0* 8.0* 8.3*  MG 2.0  --  1.9 1.9 2.0 1.9   GFR: Estimated Creatinine Clearance: 14.6 mL/min (A) (by C-G formula based on SCr of 2.25 mg/dL (H)). Liver Function Tests: Recent Labs  Lab 03/04/21 1414  AST 17  ALT 13  ALKPHOS 80  BILITOT 0.9  PROT 5.9*  ALBUMIN 3.3*   No results for input(s): LIPASE, AMYLASE in the last 168 hours. No results for input(s): AMMONIA in the last 168 hours. Coagulation Profile: Recent Labs  Lab 03/04/21 1414  INR 0.9   Cardiac Enzymes: No results for input(s): CKTOTAL, CKMB, CKMBINDEX, TROPONINI in the last 168 hours. BNP (last 3 results) No results for input(s): PROBNP in the last 8760 hours. HbA1C: No results for input(s): HGBA1C in the last 72 hours. CBG: Recent Labs  Lab 03/08/21 1230 03/08/21 1637 03/08/21 2338 03/09/21 0841 03/09/21 1256  GLUCAP 247* 190* 155* 313* 104*   Lipid Profile: No results for input(s): CHOL, HDL, LDLCALC, TRIG, CHOLHDL, LDLDIRECT in the last 72  hours. Thyroid Function Tests: No results for input(s): TSH, T4TOTAL, FREET4, T3FREE, THYROIDAB in the last 72 hours. Anemia Panel: No results for input(s): VITAMINB12, FOLATE, FERRITIN, TIBC, IRON, RETICCTPCT in the last 72 hours. Sepsis Labs: Recent Labs  Lab 03/04/21 1414 03/04/21 1746  LATICACIDVEN 2.6* 1.5    Recent Results (from the past 240 hour(s))  Blood culture (routine single)     Status: None   Collection Time: 03/04/21  2:34 PM   Specimen: BLOOD  Result Value Ref Range Status   Specimen Description BLOOD BLOOD LEFT HAND  Final   Special Requests   Final    BOTTLES DRAWN AEROBIC AND ANAEROBIC Blood Culture adequate volume   Culture   Final    NO GROWTH 5 DAYS Performed at [facility], [address], [place],  [address]  Report Status 03/09/2021 FINAL  Final  Resp Panel by RT-PCR (Flu A&B, Covid) Nasopharyngeal Swab     Status: Abnormal   Collection Time: 03/04/21  5:40 PM   Specimen: Nasopharyngeal Swab; Nasopharyngeal(NP) swabs in vial transport medium  Result Value Ref Range Status   SARS Coronavirus 2 by RT PCR POSITIVE (A) NEGATIVE Final    Comment: RESULT CALLED TO, READ BACK BY AND VERIFIED WITH: GIORGIA HAHLE AT 9924 ON 03/04/21 BY SS (NOTE) SARS-CoV-2 target nucleic acids are DETECTED.  The SARS-CoV-2 RNA is generally detectable in upper respiratory specimens during the acute phase of infection. Positive results are indicative of the presence of the identified virus, but do not rule out bacterial infection or co-infection with other pathogens not detected by the test. Clinical correlation with patient history and other diagnostic information is necessary to determine patient infection status. The expected result is Negative.  Fact Sheet for Patients: EntrepreneurPulse.com.au  Fact Sheet for Healthcare Providers: IncredibleEmployment.be  This test is not yet approved or cleared by the [place] FDA and  has been authorized for detection and/or diagnosis of SARS-CoV-2 by FDA under an Emergency Use Authorization (EUA).  This EUA will remain in effect (meaning this test  can be used) for the duration of  the COVID-19 declaration under Section 564(b)(1) of the Act, 21 U.S.C. section 360bbb-3(b)(1), unless the authorization is terminated or revoked sooner.     Influenza A by PCR NEGATIVE NEGATIVE Final   Influenza B by PCR NEGATIVE NEGATIVE Final    Comment: (NOTE) The Xpert Xpress SARS-CoV-2/FLU/RSV plus assay is intended as an aid in the diagnosis of influenza from Nasopharyngeal swab specimens and should not be used as a sole basis for treatment. Nasal washings and aspirates are unacceptable for Xpert Xpress SARS-CoV-2/FLU/RSV testing.  Fact Sheet for Patients: EntrepreneurPulse.com.au  Fact Sheet for Healthcare Providers: IncredibleEmployment.be  This test is not yet approved or cleared by the [place] FDA and has been authorized for detection and/or diagnosis of SARS-CoV-2 by FDA under an Emergency Use Authorization (EUA). This EUA will remain in effect (meaning this test can be used) for the duration of the COVID-19 declaration under Section 564(b)(1) of the Act, 21 U.S.C. section 360bbb-3(b)(1), unless the authorization is terminated or revoked.  Performed at [facility], [address] [place], [place] [address]   Urine Culture     Status: Abnormal   Collection Time: 03/04/21  5:40 PM   Specimen: In/Out Cath Urine  Result Value Ref Range Status   Specimen Description   Final    IN/OUT CATH URINE Performed at [facility], [address] [place], [place] [address]    Special Requests   Final    NONE Performed at [facility], [address] [place], [place] [address]    Culture (A)  Final    <10,000 COLONIES/mL INSIGNIFICANT GROWTH Performed at [facility], [place]  [address] [place], [place] [address]    Report Status 03/05/2021 FINAL  Final  MRSA Next Gen by PCR, Nasal     Status: None   Collection Time: 03/06/21  4:40 AM   Specimen: Nasal Mucosa; Nasal Swab  Result Value Ref Range Status   MRSA by PCR Next Gen NOT DETECTED NOT DETECTED Final    Comment: (NOTE) The GeneXpert MRSA Assay (FDA approved for NASAL specimens only), is one component of a comprehensive MRSA colonization surveillance program. It is not intended to diagnose MRSA infection nor to guide or  monitor treatment for MRSA infections. Test performance is not FDA approved in patients less than 56 years old. Performed at [facility], [address], [place], [place] [address]       Radiology Studies: No results found.   Scheduled Meds:  donepezil  10 mg Oral QHS   heparin  5,000 Units Subcutaneous Q8H   insulin aspart  0-9 Units Subcutaneous TID WC   insulin aspart  5 Units Subcutaneous TID WC   [START ON 03/10/2021] insulin glargine-yfgn  15 Units Subcutaneous Daily   levothyroxine  125 mcg Oral Q0600   memantine  10 mg Oral BID   metoprolol tartrate  50 mg Oral BID   polyethylene glycol  17 g Oral BID   sertraline  25 mg Oral QHS   sodium chloride flush  3 mL Intravenous Q12H   Continuous Infusions:   LOS: 4 days     Enzo Bi, MD [facility] If 7PM-7AM, please contact night-coverage 03/09/2021, 3:00 PM

## 2021-03-10 DIAGNOSIS — S32511A Fracture of superior rim of right pubis, initial encounter for closed fracture: Secondary | ICD-10-CM | POA: Diagnosis not present

## 2021-03-10 LAB — BASIC METABOLIC PANEL
Anion gap: 8 (ref 5–15)
BUN: 40 mg/dL — ABNORMAL HIGH (ref 8–23)
CO2: 24 mmol/L (ref 22–32)
Calcium: 8.7 mg/dL — ABNORMAL LOW (ref 8.9–10.3)
Chloride: 103 mmol/L (ref 98–111)
Creatinine, Ser: 2.07 mg/dL — ABNORMAL HIGH (ref 0.44–1.00)
GFR, Estimated: 22 mL/min — ABNORMAL LOW (ref >60)
Glucose, Bld: 260 mg/dL — ABNORMAL HIGH (ref 70–99)
Potassium: 4.7 mmol/L (ref 3.5–5.1)
Sodium: 135 mmol/L (ref 135–145)

## 2021-03-10 LAB — GLUCOSE, CAPILLARY
Glucose-Capillary: 288 mg/dL — ABNORMAL HIGH (ref 70–99)
Glucose-Capillary: 211 mg/dL — ABNORMAL HIGH (ref 70–99)
Glucose-Capillary: 112 mg/dL — ABNORMAL HIGH (ref 70–99)
Glucose-Capillary: 48 mg/dL — ABNORMAL LOW (ref 70–99)
Glucose-Capillary: 105 mg/dL — ABNORMAL HIGH (ref 70–99)

## 2021-03-10 LAB — CBC
HCT: 30.9 % — ABNORMAL LOW (ref 36.0–46.0)
Hemoglobin: 10.3 g/dL — ABNORMAL LOW (ref 12.0–15.0)
MCH: 28.9 pg (ref 26.0–34.0)
MCHC: 33.3 g/dL (ref 30.0–36.0)
MCV: 86.6 fL (ref 80.0–100.0)
Platelets: 256 10*3/uL (ref 150–400)
RBC: 3.57 MIL/uL — ABNORMAL LOW (ref 3.87–5.11)
RDW: 13.4 % (ref 11.5–15.5)
WBC: 8.3 10*3/uL (ref 4.0–10.5)
nRBC: 0 % (ref 0.0–0.2)

## 2021-03-10 LAB — MAGNESIUM: Magnesium: 2 mg/dL (ref 1.7–2.4)

## 2021-03-10 MED ORDER — INSULIN GLARGINE-YFGN 100 UNIT/ML ~~LOC~~ SOLN
15.0000 [IU] | Freq: Every day | SUBCUTANEOUS | Status: DC
  Administered 2021-03-11 – 2021-03-13 (×3): 15 [IU] via SUBCUTANEOUS
  Filled 2021-03-10 (×4): qty 0.15

## 2021-03-10 MED ORDER — INSULIN GLARGINE-YFGN 100 UNIT/ML ~~LOC~~ SOLN
20.0000 [IU] | Freq: Every day | SUBCUTANEOUS | Status: DC
  Administered 2021-03-10: 20 [IU] via SUBCUTANEOUS
  Filled 2021-03-10 (×2): qty 0.2

## 2021-03-10 NOTE — Progress Notes (Signed)
Cbg 48 1642 gave half of d50 per protocol will recheck cbg in 15 minutes MD aware.

## 2021-03-10 NOTE — Progress Notes (Signed)
Cbg recheck after half of d50 and some tea cbg now 112 MD aware.

## 2021-03-10 NOTE — Progress Notes (Signed)
PROGRESS NOTE    Katelyn Arroyo  YKD:[id number] DOB: Oct 29, 1928 DOA: 03/04/2021 PCP: [facility]  145A/145A-AA   Assessment & Plan:   Principal Problem:   Syncope Active Problems:   Essential hypertension   Dementia (HCC)   COPD (chronic obstructive pulmonary disease) (HCC)   Hyperlipidemia   Closed fracture of superior pubic ramus (HCC)   Closed sacral fracture (HCC)   Compression fracture of T2 vertebra (HCC)   COVID-19 virus infection   Insulin dependent type 2 diabetes mellitus ([place])   87 y/o F w/ PMH of dementia, insulin-dependent type 2 diabetes with admission for DKA earlier this year, hypothyroidism, depression who presented to the ED from SNF after a syncopal episode.  She was reportedly found unresponsive in her shower facedown by staff.  Patient is poor historian due to dementia and limited history was provided by SNF staff per EMS.  She was reportedly hypotensive with EMS and given half liter bolus IV fluids.   On admission was complaining of abdominal low back pain without radiation.     Patient was found to be positive for COVID-19 but denying respiratory symptoms or feeling sick.     Imaging studies revealed minimally displaced superior pubic ramus fracture and right sacral ala fracture.  Orthopedic surgery was consulted.     Syncopal episode  -etiology unclear given very limited history.  Differential includes hypoglycemia, vasovagal syncope, arrhythmia versus neurogenic.  May also have had a fall first followed by syncope. Head CT without acute intracranial findings. Neck CT without acute C-spine fracture. Pelvic and sacral fractures as below. Evaluation: Carotid ultrasound negative. Echo with EF greater than 39%, grade 1 diastolic dysfunction, mild LVH   Hyperkalemia -resolved --resolved with Lokelma x1 --monitor   Right superior right ramus fracture Right sacral ala fracture  -likely sustained during the fall --orthopedic surgery  consulted, rec Conservative management with pain control Plan: --PT/OT rec SNF  --Weightbearing as tolerated --will need to follow up with ortho in [place]  Insulin-dependent type 2 diabetes, poorly controlled --A1c 9.1 --increase glargine to 15u daily --cont mealtime 5u TID --SSI TID  COVID-19 infection, asymptomatic -- No COVID-specific treatment unless symptoms emerge --need 10-day isolation before going to SNF, will be out of isolation on 11/4   CKD -likely stage IV renal function at baseline unclear. --oral hydration  Hypothyroidism  - Continue Synthroid   Depression  -continue home Zoloft  Dementia  -continue Namenda and Aricept  Pulmonary nodule  -7 mm incidental finding on CT scan.  Repeat CT in 6 to 12 months per radiology guidelines.  Outpatient follow-up.   DVT prophylaxis: Heparin SQ Code Status: DNR  Family Communication:  Level of care: Med-Surg Dispo:   The patient is from: ALF Anticipated d/c is to: daughter is arranging for medical transport to take pt to a facility in [place] when pt is out of isolation Anticipated d/c date is: 11/4  Patient currently is medically ready to d/c.   Subjective and Interval History:  Pt had no new complaint today.   Objective: Vitals:   03/09/21 2144 03/10/21 0338 03/10/21 0500 03/10/21 0815  BP: (!) 141/80 (!) 143/57  (!) 162/81  Pulse: 73 (!) 50  71  Resp: 17 16  16   Temp: 98.3 F (36.8 C) 97.9 F (36.6 C)  97.7 F (36.5 C)  TempSrc:  Oral    SpO2: 97% 99%  95%  Weight:   63 kg   Height:        Intake/Output Summary (  Last 24 hours) at 03/10/2021 1449 Last data filed at 03/10/2021 1300 Gross per 24 hour  Intake 53 ml  Output 1300 ml  Net -1247 ml   Filed Weights   03/06/21 0500 03/09/21 0559 03/10/21 0500  Weight: 66.3 kg 63.6 kg 63 kg    Examination:   Constitutional: NAD, alert, oriented to person and place HEENT: conjunctivae and lids normal, EOMI CV: No cyanosis.   RESP: normal  respiratory effort, on RA   Data Reviewed: I have personally reviewed following labs and imaging studies  CBC: Recent Labs  Lab 03/04/21 1414 03/05/21 0657 03/06/21 0218 03/07/21 0504 03/08/21 0536 03/09/21 0506 03/10/21 0453  WBC 7.1   < > 6.1 6.6 6.7 7.3 8.3  NEUTROABS 5.0  --   --   --   --   --   --   HGB 11.2*   < > 9.9* 10.1* 9.9* 9.9* 10.3*  HCT 33.6*   < > 29.4* 30.3* 29.3* 29.1* 30.9*  MCV 88.0   < > 88.3 85.6 85.9 87.1 86.6  PLT 244   < > 197 197 206 217 256   < > = values in this interval not displayed.   Basic Metabolic Panel: Recent Labs  Lab 03/06/21 0218 03/07/21 0504 03/08/21 0536 03/09/21 0506 03/10/21 0453  NA 133* 136 133* 133* 135  K 4.9 4.5 4.8 4.7 4.7  CL 105 108 102 100 103  CO2 23 24 24 24 24   GLUCOSE 303* 141* 195* 242* 260*  BUN 37* 29* 37* 36* 40*  CREATININE 2.50* 2.18* 2.57* 2.25* 2.07*  CALCIUM 7.9* 8.0* 8.0* 8.3* 8.7*  MG 1.9 1.9 2.0 1.9 2.0   GFR: Estimated Creatinine Clearance: 15.9 mL/min (A) (by C-G formula based on SCr of 2.07 mg/dL (H)). Liver Function Tests: Recent Labs  Lab 03/04/21 1414  AST 17  ALT 13  ALKPHOS 80  BILITOT 0.9  PROT 5.9*  ALBUMIN 3.3*   No results for input(s): LIPASE, AMYLASE in the last 168 hours. No results for input(s): AMMONIA in the last 168 hours. Coagulation Profile: Recent Labs  Lab 03/04/21 1414  INR 0.9   Cardiac Enzymes: No results for input(s): CKTOTAL, CKMB, CKMBINDEX, TROPONINI in the last 168 hours. BNP (last 3 results) No results for input(s): PROBNP in the last 8760 hours. HbA1C: No results for input(s): HGBA1C in the last 72 hours. CBG: Recent Labs  Lab 03/09/21 1256 03/09/21 1622 03/09/21 2146 03/10/21 0819 03/10/21 1306  GLUCAP 104* 243* 184* 288* 211*   Lipid Profile: No results for input(s): CHOL, HDL, LDLCALC, TRIG, CHOLHDL, LDLDIRECT in the last 72 hours. Thyroid Function Tests: No results for input(s): TSH, T4TOTAL, FREET4, T3FREE, THYROIDAB in the last  72 hours. Anemia Panel: No results for input(s): VITAMINB12, FOLATE, FERRITIN, TIBC, IRON, RETICCTPCT in the last 72 hours. Sepsis Labs: Recent Labs  Lab 03/04/21 1414 03/04/21 1746  LATICACIDVEN 2.6* 1.5    Recent Results (from the past 240 hour(s))  Blood culture (routine single)     Status: None   Collection Time: 03/04/21  2:34 PM   Specimen: BLOOD  Result Value Ref Range Status   Specimen Description BLOOD BLOOD LEFT HAND  Final   Special Requests   Final    BOTTLES DRAWN AEROBIC AND ANAEROBIC Blood Culture adequate volume   Culture   Final    NO GROWTH 5 DAYS Performed at [facility], [address] [place], [place] [address]    Report Status 03/09/2021 FINAL  Final  Resp Panel by RT-PCR (Flu A&B, Covid) Nasopharyngeal Swab     Status: Abnormal   Collection Time: 03/04/21  5:40 PM   Specimen: Nasopharyngeal Swab; Nasopharyngeal(NP) swabs in vial transport medium  Result Value Ref Range Status   SARS Coronavirus 2 by RT PCR POSITIVE (A) NEGATIVE Final    Comment: RESULT CALLED TO, READ BACK BY AND VERIFIED WITH: GIORGIA HAHLE AT 4650 ON 03/04/21 BY SS (NOTE) SARS-CoV-2 target nucleic acids are DETECTED.  The SARS-CoV-2 RNA is generally detectable in upper respiratory specimens during the acute phase of infection. Positive results are indicative of the presence of the identified virus, but do not rule out bacterial infection or co-infection with other pathogens not detected by the test. Clinical correlation with patient history and other diagnostic information is necessary to determine patient infection status. The expected result is Negative.  Fact Sheet for Patients: EntrepreneurPulse.com.au  Fact Sheet for Healthcare Providers: IncredibleEmployment.be  This test is not yet approved or cleared by the [place] FDA and  has been authorized for detection and/or diagnosis of SARS-CoV-2 by FDA under an Emergency  Use Authorization (EUA).  This EUA will remain in effect (meaning this test  can be used) for the duration of  the COVID-19 declaration under Section 564(b)(1) of the Act, 21 U.S.C. section 360bbb-3(b)(1), unless the authorization is terminated or revoked sooner.     Influenza A by PCR NEGATIVE NEGATIVE Final   Influenza B by PCR NEGATIVE NEGATIVE Final    Comment: (NOTE) The Xpert Xpress SARS-CoV-2/FLU/RSV plus assay is intended as an aid in the diagnosis of influenza from Nasopharyngeal swab specimens and should not be used as a sole basis for treatment. Nasal washings and aspirates are unacceptable for Xpert Xpress SARS-CoV-2/FLU/RSV testing.  Fact Sheet for Patients: EntrepreneurPulse.com.au  Fact Sheet for Healthcare Providers: IncredibleEmployment.be  This test is not yet approved or cleared by the [place] FDA and has been authorized for detection and/or diagnosis of SARS-CoV-2 by FDA under an Emergency Use Authorization (EUA). This EUA will remain in effect (meaning this test can be used) for the duration of the COVID-19 declaration under Section 564(b)(1) of the Act, 21 U.S.C. section 360bbb-3(b)(1), unless the authorization is terminated or revoked.  Performed at [facility], [address] [place], [place] [address]   Urine Culture     Status: Abnormal   Collection Time: 03/04/21  5:40 PM   Specimen: In/Out Cath Urine  Result Value Ref Range Status   Specimen Description   Final    IN/OUT CATH URINE Performed at [facility], [address] [place], [place] [address]    Special Requests   Final    NONE Performed at [facility], [address] [place], [place] [address]    Culture (A)  Final    <10,000 COLONIES/mL INSIGNIFICANT GROWTH Performed at [facility], [place] [address], [place], [place] [address]    Report Status 03/05/2021 FINAL  Final  MRSA Next Gen by PCR, Nasal      Status: None   Collection Time: 03/06/21  4:40 AM   Specimen: Nasal Mucosa; Nasal Swab  Result Value Ref Range Status   MRSA by PCR Next Gen NOT DETECTED NOT DETECTED Final    Comment: (NOTE) The GeneXpert MRSA Assay (FDA approved for NASAL specimens only), is one component of a comprehensive MRSA colonization surveillance program. It is not intended to diagnose MRSA infection nor to guide or monitor treatment for MRSA infections.  Test performance is not FDA approved in patients less than 50 years old. Performed at [facility], [address] [place], [place] [address]       Radiology Studies: No results found.   Scheduled Meds:  donepezil  10 mg Oral QHS   heparin  5,000 Units Subcutaneous Q8H   insulin aspart  0-9 Units Subcutaneous TID WC   insulin aspart  5 Units Subcutaneous TID WC   insulin glargine-yfgn  20 Units Subcutaneous Daily   levothyroxine  125 mcg Oral Q0600   memantine  10 mg Oral BID   metoprolol tartrate  50 mg Oral BID   polyethylene glycol  17 g Oral BID   sertraline  25 mg Oral QHS   sodium chloride flush  3 mL Intravenous Q12H   Continuous Infusions:   LOS: 5 days     Enzo Bi, MD [facility] If 7PM-7AM, please contact night-coverage 03/10/2021, 2:49 PM

## 2021-03-11 DIAGNOSIS — R55 Syncope and collapse: Secondary | ICD-10-CM | POA: Diagnosis not present

## 2021-03-11 LAB — BASIC METABOLIC PANEL
Anion gap: 11 (ref 5–15)
BUN: 42 mg/dL — ABNORMAL HIGH (ref 8–23)
CO2: 21 mmol/L — ABNORMAL LOW (ref 22–32)
Calcium: 8.7 mg/dL — ABNORMAL LOW (ref 8.9–10.3)
Chloride: 103 mmol/L (ref 98–111)
Creatinine, Ser: 2.14 mg/dL — ABNORMAL HIGH (ref 0.44–1.00)
GFR, Estimated: 22 mL/min — ABNORMAL LOW (ref >60)
Glucose, Bld: 133 mg/dL — ABNORMAL HIGH (ref 70–99)
Potassium: 4.6 mmol/L (ref 3.5–5.1)
Sodium: 135 mmol/L (ref 135–145)

## 2021-03-11 LAB — CBC
HCT: 33.7 % — ABNORMAL LOW (ref 36.0–46.0)
Hemoglobin: 10.9 g/dL — ABNORMAL LOW (ref 12.0–15.0)
MCH: 28.8 pg (ref 26.0–34.0)
MCHC: 32.3 g/dL (ref 30.0–36.0)
MCV: 88.9 fL (ref 80.0–100.0)
Platelets: 343 10*3/uL (ref 150–400)
RBC: 3.79 MIL/uL — ABNORMAL LOW (ref 3.87–5.11)
RDW: 13.6 % (ref 11.5–15.5)
WBC: 13.9 10*3/uL — ABNORMAL HIGH (ref 4.0–10.5)
nRBC: 0 % (ref 0.0–0.2)

## 2021-03-11 LAB — GLUCOSE, CAPILLARY
Glucose-Capillary: 143 mg/dL — ABNORMAL HIGH (ref 70–99)
Glucose-Capillary: 178 mg/dL — ABNORMAL HIGH (ref 70–99)
Glucose-Capillary: 240 mg/dL — ABNORMAL HIGH (ref 70–99)
Glucose-Capillary: 201 mg/dL — ABNORMAL HIGH (ref 70–99)

## 2021-03-11 LAB — MAGNESIUM: Magnesium: 1.9 mg/dL (ref 1.7–2.4)

## 2021-03-11 NOTE — Progress Notes (Addendum)
Physical Therapy Treatment Patient Details Name: Katelyn Arroyo MRN: [id number] DOB: Apr 29, 1929 Today's Date: 03/11/2021   History of Present Illness Pt is an 87 y/o F with PMH: IDDM, hypothyroidism, dementia, and depression. Pt presents from facility [facility]) after fall/syncopal episode, found to have minimally displaced right superior pubic ramus fracture and right sacral ala fracture.    PT Comments    Pt initially declining session due to fatigue.  Lunch tray still available to her but mostly untouched.  Heavy right lean in bed.  Assist to reposition for supine ex x 10.  Mod assist to complete.  She is able to get to EOB with mod a x 1.  While awaiting staff to bring in grip socks and linen change as she was noted to have a BM, she is able to brush her teeth.  Fatigues quickly with post LOB and is not able to sit unsupported after imbalance due to fatigue.  She finishes teeth but is not able to attempt standing EOB and requires max a x 1 to return to supine.  Rolling left/right for care and linen change.  Pt seems generally more fatigued today with some overall global weakness compared to last PT session.   Recommendations for follow up therapy are one component of a multi-disciplinary discharge planning process, led by the attending physician.  Recommendations may be updated based on patient status, additional functional criteria and insurance authorization.  Follow Up Recommendations  Skilled nursing-short term rehab (<3 hours/day)     Assistance Recommended at Discharge    Equipment Recommendations       Recommendations for Other Services       Precautions / Restrictions Precautions Precautions: Fall Restrictions Weight Bearing Restrictions: Yes RLE Weight Bearing: Weight bearing as tolerated     Mobility  Bed Mobility Overal bed mobility: Needs Assistance Bed Mobility: Supine to Sit;Sit to Supine     Supine to sit: Mod assist;HOB elevated Sit to supine: Max  assist;Total assist        Transfers                        Ambulation/Gait                 Stairs             Wheelchair Mobility    Modified Rankin (Stroke Patients Only)       Balance Overall balance assessment: Needs assistance Sitting-balance support: Feet supported Sitting balance-Leahy Scale: Poor Sitting balance - Comments: fatigues quickly while EOB brushing teeth with post LOB and needed assist after to remain upright                                    Cognition Arousal/Alertness: Awake/alert Behavior During Therapy: WFL for tasks assessed/performed Overall Cognitive Status: History of cognitive impairments - at baseline                                          Exercises      General Comments        Pertinent Vitals/Pain Pain Assessment: Faces Faces Pain Scale: Hurts even more Pain Location: yells out with return to bed and rolling for care Pain Descriptors / Indicators: Sore;Guarding;Grimacing;Moaning Pain Intervention(s): Limited activity within patient's tolerance;Monitored during session;Repositioned  Home Living                          Prior Function            PT Goals (current goals can now be found in the care plan section) Progress towards PT goals: Progressing toward goals    Frequency    Min 2X/week      PT Plan Current plan remains appropriate    Co-evaluation              AM-PAC PT "6 Clicks" Mobility   Outcome Measure  Help needed turning from your back to your side while in a flat bed without using bedrails?: A Little Help needed moving from lying on your back to sitting on the side of a flat bed without using bedrails?: A Lot Help needed moving to and from a bed to a chair (including a wheelchair)?: Total Help needed standing up from a chair using your arms (e.g., wheelchair or bedside chair)?: A Lot Help needed to walk in hospital room?:  Total Help needed climbing 3-5 steps with a railing? : Total 6 Click Score: 10    End of Session Equipment Utilized During Treatment: Gait belt Activity Tolerance: Patient tolerated treatment well Patient left: with bed alarm set;with call bell/phone within reach;in bed Nurse Communication: Mobility status PT Visit Diagnosis: History of falling (Z91.81);Muscle weakness (generalized) (M62.81);Difficulty in walking, not elsewhere classified (R26.2);Pain Pain - Right/Left: Right Pain - part of body: Hip     Time: 1155-2080 PT Time Calculation (min) (ACUTE ONLY): 23 min  Charges:  $Therapeutic Activity: 23-37 mins                    Chesley Noon, PTA 03/11/21, 4:13 PM

## 2021-03-11 NOTE — Progress Notes (Signed)
PROGRESS NOTE    Katelyn Arroyo  LYY:[id number] DOB: 29-Aug-1928 DOA: 03/04/2021 PCP: [facility]  145A/145A-AA   Assessment & Plan:   Principal Problem:   Syncope Active Problems:   Essential hypertension   Dementia (HCC)   COPD (chronic obstructive pulmonary disease) (HCC)   Hyperlipidemia   Closed fracture of superior pubic ramus (HCC)   Closed sacral fracture (HCC)   Compression fracture of T2 vertebra (HCC)   COVID-19 virus infection   Insulin dependent type 2 diabetes mellitus ([place])   87 y/o F w/ PMH of dementia, insulin-dependent type 2 diabetes with admission for DKA earlier this year, hypothyroidism, depression who presented to the ED from SNF after a syncopal episode.  She was reportedly found unresponsive in her shower facedown by staff.  Patient is poor historian due to dementia and limited history was provided by SNF staff per EMS.  She was reportedly hypotensive with EMS and given half liter bolus IV fluids.   On admission was complaining of abdominal low back pain without radiation.     Patient was found to be positive for COVID-19 but denying respiratory symptoms or feeling sick.     Imaging studies revealed minimally displaced superior pubic ramus fracture and right sacral ala fracture.  Orthopedic surgery was consulted.     Syncopal episode  -etiology unclear given very limited history.  Differential includes hypoglycemia, vasovagal syncope, arrhythmia versus neurogenic.  May also have had a fall first followed by syncope. Head CT without acute intracranial findings. Neck CT without acute C-spine fracture. Pelvic and sacral fractures as below. Evaluation: Carotid ultrasound negative. Echo with EF greater than 12%, grade 1 diastolic dysfunction, mild LVH   Hyperkalemia -resolved --resolved with Lokelma x1 --monitor   Right superior right ramus fracture Right sacral ala fracture  -likely sustained during the fall --orthopedic surgery  consulted, rec Conservative management with pain control Plan: --PT/OT rec SNF  --Weightbearing as tolerated --will need to follow up with ortho in [place]  Insulin-dependent type 2 diabetes, poorly controlled --A1c 9.1 --cont glargine 15u daily --SSI TID  COVID-19 infection, asymptomatic -- No COVID-specific treatment unless symptoms emerge --need 10-day isolation before going to SNF, will be out of isolation on 11/4   CKD -likely stage IV renal function at baseline unclear. --oral hydration  Hypothyroidism  - Continue Synthroid   Depression  -continue home Zoloft  Dementia  -continue Namenda and Aricept  Pulmonary nodule  -7 mm incidental finding on CT scan.  Repeat CT in 6 to 12 months per radiology guidelines.  Outpatient follow-up.   DVT prophylaxis: Heparin SQ Code Status: DNR  Family Communication:  Level of care: Med-Surg Dispo:   The patient is from: ALF Anticipated d/c is to: daughter is arranging for medical transport to take pt to a facility in [place] when pt is out of isolation Anticipated d/c date is: 11/4  Patient currently is medically ready to d/c.   Subjective and Interval History:  Pt reported still having pain in her pelvis when she moved.   Objective: Vitals:   03/11/21 0559 03/11/21 0829 03/11/21 1620 03/11/21 2144  BP: (!) 126/57 136/71 140/67 (!) 161/63  Pulse: 79 81 76 75  Resp: 14 19 16 17   Temp: 98.1 F (36.7 C) 98.6 F (37 C) 98.6 F (37 C) 99 F (37.2 C)  TempSrc:   Oral Oral  SpO2: 95% 99% 100% 94%  Weight:      Height:        Intake/Output Summary (  Last 24 hours) at 03/11/2021 2237 Last data filed at 03/11/2021 1100 Gross per 24 hour  Intake --  Output 100 ml  Net -100 ml   Filed Weights   03/09/21 0559 03/10/21 0500 03/11/21 0500  Weight: 63.6 kg 63 kg 60.7 kg    Examination:   Constitutional: NAD, AAOx3 HEENT: conjunctivae and lids normal, EOMI CV: No cyanosis.   RESP: normal respiratory effort, on  RA Psych: depressed mood and affect.     Data Reviewed: I have personally reviewed following labs and imaging studies  CBC: Recent Labs  Lab 03/07/21 0504 03/08/21 0536 03/09/21 0506 03/10/21 0453 03/11/21 0622  WBC 6.6 6.7 7.3 8.3 13.9*  HGB 10.1* 9.9* 9.9* 10.3* 10.9*  HCT 30.3* 29.3* 29.1* 30.9* 33.7*  MCV 85.6 85.9 87.1 86.6 88.9  PLT 197 206 217 256 373   Basic Metabolic Panel: Recent Labs  Lab 03/07/21 0504 03/08/21 0536 03/09/21 0506 03/10/21 0453 03/11/21 0622  NA 136 133* 133* 135 135  K 4.5 4.8 4.7 4.7 4.6  CL 108 102 100 103 103  CO2 24 24 24 24  21*  GLUCOSE 141* 195* 242* 260* 133*  BUN 29* 37* 36* 40* 42*  CREATININE 2.18* 2.57* 2.25* 2.07* 2.14*  CALCIUM 8.0* 8.0* 8.3* 8.7* 8.7*  MG 1.9 2.0 1.9 2.0 1.9   GFR: Estimated Creatinine Clearance: 15.4 mL/min (A) (by C-G formula based on SCr of 2.14 mg/dL (H)). Liver Function Tests: No results for input(s): AST, ALT, ALKPHOS, BILITOT, PROT, ALBUMIN in the last 168 hours.  No results for input(s): LIPASE, AMYLASE in the last 168 hours. No results for input(s): AMMONIA in the last 168 hours. Coagulation Profile: No results for input(s): INR, PROTIME in the last 168 hours.  Cardiac Enzymes: No results for input(s): CKTOTAL, CKMB, CKMBINDEX, TROPONINI in the last 168 hours. BNP (last 3 results) No results for input(s): PROBNP in the last 8760 hours. HbA1C: No results for input(s): HGBA1C in the last 72 hours. CBG: Recent Labs  Lab 03/10/21 2011 03/11/21 0830 03/11/21 1246 03/11/21 1658 03/11/21 2143  GLUCAP 105* 143* 178* 240* 201*   Lipid Profile: No results for input(s): CHOL, HDL, LDLCALC, TRIG, CHOLHDL, LDLDIRECT in the last 72 hours. Thyroid Function Tests: No results for input(s): TSH, T4TOTAL, FREET4, T3FREE, THYROIDAB in the last 72 hours. Anemia Panel: No results for input(s): VITAMINB12, FOLATE, FERRITIN, TIBC, IRON, RETICCTPCT in the last 72 hours. Sepsis Labs: No results for  input(s): PROCALCITON, LATICACIDVEN in the last 168 hours.   Recent Results (from the past 240 hour(s))  Blood culture (routine single)     Status: None   Collection Time: 03/04/21  2:34 PM   Specimen: BLOOD  Result Value Ref Range Status   Specimen Description BLOOD BLOOD LEFT HAND  Final   Special Requests   Final    BOTTLES DRAWN AEROBIC AND ANAEROBIC Blood Culture adequate volume   Culture   Final    NO GROWTH 5 DAYS Performed at [facility], [address] [place], [place] [address]    Report Status 03/09/2021 FINAL  Final  Resp Panel by RT-PCR (Flu A&B, Covid) Nasopharyngeal Swab     Status: Abnormal   Collection Time: 03/04/21  5:40 PM   Specimen: Nasopharyngeal Swab; Nasopharyngeal(NP) swabs in vial transport medium  Result Value Ref Range Status   SARS Coronavirus 2 by RT PCR POSITIVE (A) NEGATIVE Final    Comment: RESULT CALLED TO, READ BACK BY AND VERIFIED WITH: GIORGIA HAHLE AT 8115  ON 03/04/21 BY SS (NOTE) SARS-CoV-2 target nucleic acids are DETECTED.  The SARS-CoV-2 RNA is generally detectable in upper respiratory specimens during the acute phase of infection. Positive results are indicative of the presence of the identified virus, but do not rule out bacterial infection or co-infection with other pathogens not detected by the test. Clinical correlation with patient history and other diagnostic information is necessary to determine patient infection status. The expected result is Negative.  Fact Sheet for Patients: EntrepreneurPulse.com.au  Fact Sheet for Healthcare Providers: IncredibleEmployment.be  This test is not yet approved or cleared by the [place] FDA and  has been authorized for detection and/or diagnosis of SARS-CoV-2 by FDA under an Emergency Use Authorization (EUA).  This EUA will remain in effect (meaning this test  can be used) for the duration of  the COVID-19 declaration under Section  564(b)(1) of the Act, 21 U.S.C. section 360bbb-3(b)(1), unless the authorization is terminated or revoked sooner.     Influenza A by PCR NEGATIVE NEGATIVE Final   Influenza B by PCR NEGATIVE NEGATIVE Final    Comment: (NOTE) The Xpert Xpress SARS-CoV-2/FLU/RSV plus assay is intended as an aid in the diagnosis of influenza from Nasopharyngeal swab specimens and should not be used as a sole basis for treatment. Nasal washings and aspirates are unacceptable for Xpert Xpress SARS-CoV-2/FLU/RSV testing.  Fact Sheet for Patients: EntrepreneurPulse.com.au  Fact Sheet for Healthcare Providers: IncredibleEmployment.be  This test is not yet approved or cleared by the [place] FDA and has been authorized for detection and/or diagnosis of SARS-CoV-2 by FDA under an Emergency Use Authorization (EUA). This EUA will remain in effect (meaning this test can be used) for the duration of the COVID-19 declaration under Section 564(b)(1) of the Act, 21 U.S.C. section 360bbb-3(b)(1), unless the authorization is terminated or revoked.  Performed at [facility], [address], [place], [place] [address]   Urine Culture     Status: Abnormal   Collection Time: 03/04/21  5:40 PM   Specimen: In/Out Cath Urine  Result Value Ref Range Status   Specimen Description   Final    IN/OUT CATH URINE Performed at [facility], [address] [place], [place] [address]    Special Requests   Final    NONE Performed at [facility], [address] [place], [place] [address]    Culture (A)  Final    <10,000 COLONIES/mL INSIGNIFICANT GROWTH Performed at [facility], [place] [address], [place], [place] [address]    Report Status 03/05/2021 FINAL  Final  MRSA Next Gen by PCR, Nasal     Status: None   Collection Time: 03/06/21  4:40 AM   Specimen: Nasal Mucosa; Nasal Swab  Result Value Ref Range Status   MRSA by PCR Next Gen NOT  DETECTED NOT DETECTED Final    Comment: (NOTE) The GeneXpert MRSA Assay (FDA approved for NASAL specimens only), is one component of a comprehensive MRSA colonization surveillance program. It is not intended to diagnose MRSA infection nor to guide or monitor treatment for MRSA infections. Test performance is not FDA approved in patients less than 31 years old. Performed at [facility], [address] [place],  [address]       Radiology Studies: No results found.   Scheduled Meds:  donepezil  10 mg Oral QHS   heparin  5,000 Units Subcutaneous Q8H   insulin aspart  0-9 Units Subcutaneous TID WC   insulin glargine-yfgn  15  Units Subcutaneous Daily   levothyroxine  125 mcg Oral Q0600   memantine  10 mg Oral BID   metoprolol tartrate  50 mg Oral BID   polyethylene glycol  17 g Oral BID   sertraline  25 mg Oral QHS   sodium chloride flush  3 mL Intravenous Q12H   Continuous Infusions:   LOS: 6 days     Enzo Bi, MD [facility] If 7PM-7AM, please contact night-coverage 03/11/2021, 10:37 PM

## 2021-03-12 DIAGNOSIS — S32511A Fracture of superior rim of right pubis, initial encounter for closed fracture: Secondary | ICD-10-CM | POA: Diagnosis not present

## 2021-03-12 LAB — GLUCOSE, CAPILLARY
Glucose-Capillary: 260 mg/dL — ABNORMAL HIGH (ref 70–99)
Glucose-Capillary: 169 mg/dL — ABNORMAL HIGH (ref 70–99)
Glucose-Capillary: 85 mg/dL (ref 70–99)
Glucose-Capillary: 182 mg/dL — ABNORMAL HIGH (ref 70–99)

## 2021-03-12 LAB — BASIC METABOLIC PANEL
Anion gap: 12 (ref 5–15)
BUN: 46 mg/dL — ABNORMAL HIGH (ref 8–23)
CO2: 21 mmol/L — ABNORMAL LOW (ref 22–32)
Calcium: 8.7 mg/dL — ABNORMAL LOW (ref 8.9–10.3)
Chloride: 101 mmol/L (ref 98–111)
Creatinine, Ser: 2.21 mg/dL — ABNORMAL HIGH (ref 0.44–1.00)
GFR, Estimated: 21 mL/min — ABNORMAL LOW (ref >60)
Glucose, Bld: 243 mg/dL — ABNORMAL HIGH (ref 70–99)
Potassium: 4.5 mmol/L (ref 3.5–5.1)
Sodium: 134 mmol/L — ABNORMAL LOW (ref 135–145)

## 2021-03-12 LAB — CBC
HCT: 30.7 % — ABNORMAL LOW (ref 36.0–46.0)
Hemoglobin: 10.4 g/dL — ABNORMAL LOW (ref 12.0–15.0)
MCH: 29.4 pg (ref 26.0–34.0)
MCHC: 33.9 g/dL (ref 30.0–36.0)
MCV: 86.7 fL (ref 80.0–100.0)
Platelets: 296 10*3/uL (ref 150–400)
RBC: 3.54 MIL/uL — ABNORMAL LOW (ref 3.87–5.11)
RDW: 13.3 % (ref 11.5–15.5)
WBC: 13.7 10*3/uL — ABNORMAL HIGH (ref 4.0–10.5)
nRBC: 0 % (ref 0.0–0.2)

## 2021-03-12 LAB — MAGNESIUM: Magnesium: 1.9 mg/dL (ref 1.7–2.4)

## 2021-03-12 NOTE — Progress Notes (Signed)
PROGRESS NOTE    Katelyn Arroyo  XTK:[id number] DOB: [birth year] DOA: 03/04/2021 PCP: [facility]  145A/145A-AA   Assessment & Plan:   Principal Problem:   Syncope Active Problems:   Essential hypertension   Dementia (HCC)   COPD (chronic obstructive pulmonary disease) (HCC)   Hyperlipidemia   Closed fracture of superior pubic ramus (HCC)   Closed sacral fracture (HCC)   Compression fracture of T2 vertebra (HCC)   COVID-19 virus infection   Insulin dependent type 2 diabetes mellitus ([place])   87 y/o F w/ PMH of dementia, insulin-dependent type 2 diabetes with admission for DKA earlier this year, hypothyroidism, depression who presented to the ED from SNF after a syncopal episode.  She was reportedly found unresponsive in her shower facedown by staff.  Patient is poor historian due to dementia and limited history was provided by SNF staff per EMS.  She was reportedly hypotensive with EMS and given half liter bolus IV fluids.   On admission was complaining of abdominal low back pain without radiation.     Patient was found to be positive for COVID-19 but denying respiratory symptoms or feeling sick.     Imaging studies revealed minimally displaced superior pubic ramus fracture and right sacral ala fracture.  Orthopedic surgery was consulted.     Syncopal episode  -etiology unclear given very limited history.  Differential includes hypoglycemia, vasovagal syncope, arrhythmia versus neurogenic.  May also have had a fall first followed by syncope. Head CT without acute intracranial findings. Neck CT without acute C-spine fracture. Pelvic and sacral fractures as below. Evaluation: Carotid ultrasound negative. Echo with EF greater than 99%, grade 1 diastolic dysfunction, mild LVH   Hyperkalemia -resolved --resolved with Lokelma x1 --monitor   Right superior right ramus fracture Right sacral ala fracture  -likely sustained during the fall --orthopedic surgery  consulted, rec Conservative management with pain control Plan: --PT/OT rec SNF  --Weightbearing as tolerated --will need to follow up with ortho in [place]  Insulin-dependent type 2 diabetes, poorly controlled --A1c 9.1 --cont glargine 15u daily --SSI TID  COVID-19 infection, asymptomatic -- No COVID-specific treatment unless symptoms emerge --need 10-day isolation before going to SNF, will be out of isolation on 11/4   CKD -likely stage IV renal function at baseline unclear. --oral hydration  Hypothyroidism  - Continue Synthroid   Depression  -continue home Zoloft  Dementia  -continue Namenda and Aricept  Pulmonary nodule  -7 mm incidental finding on CT scan.  Repeat CT in 6 to 12 months per radiology guidelines.  Outpatient follow-up.   DVT prophylaxis: Heparin SQ Code Status: DNR  Family Communication:  Level of care: Med-Surg Dispo:   The patient is from: ALF Anticipated d/c is to: daughter is arranging for medical transport to take pt to a facility in [place] when pt is out of isolation Anticipated d/c date is: 11/4, 10 pm by medical transport  Patient currently is medically ready to d/c.   Subjective and Interval History:  No new complaints or issue today.     Objective: Vitals:   03/12/21 0550 03/12/21 0838 03/12/21 1204 03/12/21 1513  BP: (!) 129/51 134/62 (!) 106/52 129/61  Pulse: 73 75 65 70  Resp: 16 18 16 13   Temp: 98.5 F (36.9 C) 98.1 F (36.7 C) (!) 97.5 F (36.4 C) 98 F (36.7 C)  TempSrc: Oral Oral Oral   SpO2: 94% 98% 96% 97%  Weight: 61.2 kg     Height:  No intake or output data in the 24 hours ending 03/12/21 1930  Filed Weights   03/10/21 0500 03/11/21 0500 03/12/21 0550  Weight: 63 kg 60.7 kg 61.2 kg    Examination:   Constitutional: NAD, sleeping CV: No cyanosis.   RESP: normal respiratory effort, on RA Extremities: No effusions, edema in BLE SKIN: warm, dry   Data Reviewed: I have personally reviewed  following labs and imaging studies  CBC: Recent Labs  Lab 03/08/21 0536 03/09/21 0506 03/10/21 0453 03/11/21 0622 03/12/21 0437  WBC 6.7 7.3 8.3 13.9* 13.7*  HGB 9.9* 9.9* 10.3* 10.9* 10.4*  HCT 29.3* 29.1* 30.9* 33.7* 30.7*  MCV 85.9 87.1 86.6 88.9 86.7  PLT 206 217 256 343 892   Basic Metabolic Panel: Recent Labs  Lab 03/08/21 0536 03/09/21 0506 03/10/21 0453 03/11/21 0622 03/12/21 0437  NA 133* 133* 135 135 134*  K 4.8 4.7 4.7 4.6 4.5  CL 102 100 103 103 101  CO2 24 24 24  21* 21*  GLUCOSE 195* 242* 260* 133* 243*  BUN 37* 36* 40* 42* 46*  CREATININE 2.57* 2.25* 2.07* 2.14* 2.21*  CALCIUM 8.0* 8.3* 8.7* 8.7* 8.7*  MG 2.0 1.9 2.0 1.9 1.9   GFR: Estimated Creatinine Clearance: 14.9 mL/min (A) (by C-G formula based on SCr of 2.21 mg/dL (H)). Liver Function Tests: No results for input(s): AST, ALT, ALKPHOS, BILITOT, PROT, ALBUMIN in the last 168 hours.  No results for input(s): LIPASE, AMYLASE in the last 168 hours. No results for input(s): AMMONIA in the last 168 hours. Coagulation Profile: No results for input(s): INR, PROTIME in the last 168 hours.  Cardiac Enzymes: No results for input(s): CKTOTAL, CKMB, CKMBINDEX, TROPONINI in the last 168 hours. BNP (last 3 results) No results for input(s): PROBNP in the last 8760 hours. HbA1C: No results for input(s): HGBA1C in the last 72 hours. CBG: Recent Labs  Lab 03/11/21 1658 03/11/21 2143 03/12/21 0836 03/12/21 1200 03/12/21 1516  GLUCAP 240* 201* 260* 169* 85   Lipid Profile: No results for input(s): CHOL, HDL, LDLCALC, TRIG, CHOLHDL, LDLDIRECT in the last 72 hours. Thyroid Function Tests: No results for input(s): TSH, T4TOTAL, FREET4, T3FREE, THYROIDAB in the last 72 hours. Anemia Panel: No results for input(s): VITAMINB12, FOLATE, FERRITIN, TIBC, IRON, RETICCTPCT in the last 72 hours. Sepsis Labs: No results for input(s): PROCALCITON, LATICACIDVEN in the last 168 hours.   Recent Results (from the  past 240 hour(s))  Blood culture (routine single)     Status: None   Collection Time: 03/04/21  2:34 PM   Specimen: BLOOD  Result Value Ref Range Status   Specimen Description BLOOD BLOOD LEFT HAND  Final   Special Requests   Final    BOTTLES DRAWN AEROBIC AND ANAEROBIC Blood Culture adequate volume   Culture   Final    NO GROWTH 5 DAYS Performed at [facility], [address] [place], [place] [address]    Report Status 03/09/2021 FINAL  Final  Resp Panel by RT-PCR (Flu A&B, Covid) Nasopharyngeal Swab     Status: Abnormal   Collection Time: 03/04/21  5:40 PM   Specimen: Nasopharyngeal Swab; Nasopharyngeal(NP) swabs in vial transport medium  Result Value Ref Range Status   SARS Coronavirus 2 by RT PCR POSITIVE (A) NEGATIVE Final    Comment: RESULT CALLED TO, READ BACK BY AND VERIFIED WITH: GIORGIA HAHLE AT 7408 ON 03/04/21 BY SS (NOTE) SARS-CoV-2 target nucleic acids are DETECTED.  The SARS-CoV-2 RNA is generally detectable in upper  respiratory specimens during the acute phase of infection. Positive results are indicative of the presence of the identified virus, but do not rule out bacterial infection or co-infection with other pathogens not detected by the test. Clinical correlation with patient history and other diagnostic information is necessary to determine patient infection status. The expected result is Negative.  Fact Sheet for Patients: EntrepreneurPulse.com.au  Fact Sheet for Healthcare Providers: IncredibleEmployment.be  This test is not yet approved or cleared by the [place] FDA and  has been authorized for detection and/or diagnosis of SARS-CoV-2 by FDA under an Emergency Use Authorization (EUA).  This EUA will remain in effect (meaning this test  can be used) for the duration of  the COVID-19 declaration under Section 564(b)(1) of the Act, 21 U.S.C. section 360bbb-3(b)(1), unless the authorization  is terminated or revoked sooner.     Influenza A by PCR NEGATIVE NEGATIVE Final   Influenza B by PCR NEGATIVE NEGATIVE Final    Comment: (NOTE) The Xpert Xpress SARS-CoV-2/FLU/RSV plus assay is intended as an aid in the diagnosis of influenza from Nasopharyngeal swab specimens and should not be used as a sole basis for treatment. Nasal washings and aspirates are unacceptable for Xpert Xpress SARS-CoV-2/FLU/RSV testing.  Fact Sheet for Patients: EntrepreneurPulse.com.au  Fact Sheet for Healthcare Providers: IncredibleEmployment.be  This test is not yet approved or cleared by the [place] FDA and has been authorized for detection and/or diagnosis of SARS-CoV-2 by FDA under an Emergency Use Authorization (EUA). This EUA will remain in effect (meaning this test can be used) for the duration of the COVID-19 declaration under Section 564(b)(1) of the Act, 21 U.S.C. section 360bbb-3(b)(1), unless the authorization is terminated or revoked.  Performed at [facility], [address] [place], [place] [address]   Urine Culture     Status: Abnormal   Collection Time: 03/04/21  5:40 PM   Specimen: In/Out Cath Urine  Result Value Ref Range Status   Specimen Description   Final    IN/OUT CATH URINE Performed at [facility], [address], [place], [place] [address]    Special Requests   Final    NONE Performed at [facility], [address] [place], [place] [address]    Culture (A)  Final    <10,000 COLONIES/mL INSIGNIFICANT GROWTH Performed at [facility], [place] [address] [place], [place] [address]    Report Status 03/05/2021 FINAL  Final  MRSA Next Gen by PCR, Nasal     Status: None   Collection Time: 03/06/21  4:40 AM   Specimen: Nasal Mucosa; Nasal Swab  Result Value Ref Range Status   MRSA by PCR Next Gen NOT DETECTED NOT DETECTED Final    Comment: (NOTE) The GeneXpert MRSA Assay (FDA  approved for NASAL specimens only), is one component of a comprehensive MRSA colonization surveillance program. It is not intended to diagnose MRSA infection nor to guide or monitor treatment for MRSA infections. Test performance is not FDA approved in patients less than 66 years old. Performed at [facility], [address], [place], [place] [address]       Radiology Studies: No results found.   Scheduled Meds:  donepezil  10 mg Oral QHS   heparin  5,000 Units Subcutaneous Q8H   insulin aspart  0-9 Units Subcutaneous TID WC   insulin glargine-yfgn  15 Units Subcutaneous Daily   levothyroxine  125 mcg Oral Q0600   memantine  10 mg Oral BID  metoprolol tartrate  50 mg Oral BID   polyethylene glycol  17 g Oral BID   sertraline  25 mg Oral QHS   sodium chloride flush  3 mL Intravenous Q12H   Continuous Infusions:   LOS: 7 days     Enzo Bi, MD [facility] If 7PM-7AM, please contact night-coverage 03/12/2021, 7:30 PM

## 2021-03-12 NOTE — Progress Notes (Signed)
Occupational Therapy Treatment Patient Details Name: Katelyn Arroyo MRN: [id number] DOB: 1929/03/12 Today's Date: 03/12/2021   History of present illness Pt is an 87 y/o F with PMH: IDDM, hypothyroidism, dementia, and depression. Pt presents from facility [facility]) after fall/syncopal episode, found to have minimally displaced right superior pubic ramus fracture and right sacral ala fracture.   OT comments  Upon entering the room, pt sleeping soundly in bed with no c/o pain. Multiple attempts needed to arouse pt for OT intervention. Pt washes face and hands with multimodal cuing for initiation and sequencing of task. Pt needing max A for bed mobility and begins to call out in pain with all movement. Once EOB, pt leaning towards the R side and needs min - mod A for balance with static sitting balance. Pt only able to tolerate sitting EOB for ~ 4 minutes before attempting to return to supine and needing max A for safety. Pt calling out in pain with all positioning at bed level. Once pt is positioned she quickly closes eyes and returns to sleep as therapist is exiting the room. Pt making limited pain this session secondary to fatigue and pain . OT continues to recommend short term rehab to address functional deficits at discharge.    Recommendations for follow up therapy are one component of a multi-disciplinary discharge planning process, led by the attending physician.  Recommendations may be updated based on patient status, additional functional criteria and insurance authorization.    Follow Up Recommendations  Skilled nursing-short term rehab (<3 hours/day)    Assistance Recommended at Discharge Frequent or constant Supervision/Assistance        Precautions / Restrictions Precautions Precautions: Fall Restrictions Weight Bearing Restrictions: Yes RLE Weight Bearing: Weight bearing as tolerated       Mobility Bed Mobility Overal bed mobility: Needs Assistance Bed Mobility: Supine to  Sit;Sit to Supine     Supine to sit: Max assist;HOB elevated Sit to supine: Total assist   General bed mobility comments: maximal cuing for sequencing to EOB with pain limiting movement    Transfers                   General transfer comment: Pt refusal     Balance Overall balance assessment: Needs assistance Sitting-balance support: Feet supported Sitting balance-Leahy Scale: Poor Sitting balance - Comments: min - mod static sitting balance Postural control: Right lateral lean                                 ADL either performed or assessed with clinical judgement   ADL       Grooming: Sitting;Set up;Wash/dry hands;Wash/dry face;Supervision/safety Grooming Details (indicate cue type and reason): pt needing min -mod A for sitting balance                                     Vision Patient Visual Report: No change from baseline            Cognition Arousal/Alertness: Lethargic Behavior During Therapy: WFL for tasks assessed/performed Overall Cognitive Status: History of cognitive impairments - at baseline                                 General Comments: Pt oriented to self only. She follows simple commands inconsistently but  pleasant.                     Pertinent Vitals/ Pain       Pain Assessment: Faces Faces Pain Scale: Hurts whole lot Pain Location: yells out but unable to verbalize pain location Pain Descriptors / Indicators: Aching;Crying;Guarding;Discomfort Pain Intervention(s): Limited activity within patient's tolerance;Monitored during session;Repositioned         Frequency  Min 1X/week        Progress Toward Goals  OT Goals(current goals can now be found in the care plan section)  Progress towards OT goals: Progressing toward goals  Acute Rehab OT Goals Patient Stated Goal: pt does not state OT Goal Formulation: Patient unable to participate in goal setting Time For Goal  Achievement: 03/20/21 Potential to Achieve Goals: [place] Discharge plan remains appropriate;Frequency remains appropriate       AM-PAC OT "6 Clicks" Daily Activity     Outcome Measure   Help from another person eating meals?: A Little Help from another person taking care of personal grooming?: A Little Help from another person toileting, which includes using toliet, bedpan, or urinal?: Total Help from another person bathing (including washing, rinsing, drying)?: A Lot Help from another person to put on and taking off regular upper body clothing?: A Little Help from another person to put on and taking off regular lower body clothing?: A Lot 6 Click Score: 14    End of Session Equipment Utilized During Treatment: Rolling walker (2 wheels)  OT Visit Diagnosis: Other abnormalities of gait and mobility (R26.89);Pain;Unsteadiness on feet (R26.81);Muscle weakness (generalized) (M62.81);Repeated falls (R29.6) Pain - Right/Left: Right Pain - part of body: Hip   Activity Tolerance Patient limited by pain   Patient Left in bed;with call bell/phone within reach;with bed alarm set   Nurse Communication Patient requests pain meds;Mobility status        Time: 1355-1418 OT Time Calculation (min): 23 min  Charges: OT General Charges $OT Visit: 1 Visit OT Treatments $Self Care/Home Management : 8-22 mins $Therapeutic Activity: 8-22 mins  Darleen Crocker, MS, OTR/L , CBIS ascom [phone]  03/12/21, 2:41 PM

## 2021-03-12 NOTE — TOC Progression Note (Signed)
Transition of Care [facility]) - Progression Note    Patient Details  Name: Katelyn Arroyo MRN: [id number] Date of Birth: [birth year]  Transition of Care [facility]) CM/SW [place], RN Phone Number: 03/12/2021, 10:34 AM  Clinical Narrative:     Spoke to Trixie the patient's daughter, She has arranged transport to pick up the patient on Friday at 10 PM to go to [place]  in  [place] , [place] summary will need to be faxed to [place] on Friday      Expected Discharge Plan and Services                                                 Social Determinants of Health (SDOH) Interventions    Readmission Risk Interventions No flowsheet data found.

## 2021-03-12 NOTE — Plan of Care (Signed)
No acute events during the night. NAD noted. VSS. Denies pain.  Problem: Education: Goal: Knowledge of General Education information will improve Description: Including pain rating scale, medication(s)/side effects and non-pharmacologic comfort measures Outcome: Progressing   Problem: Health Behavior/Discharge Planning: Goal: Ability to manage health-related needs will improve Outcome: Progressing   Problem: Clinical Measurements: Goal: Ability to maintain clinical measurements within normal limits will improve Outcome: Progressing Goal: Will remain free from infection Outcome: Progressing Goal: Diagnostic test results will improve Outcome: Progressing Goal: Respiratory complications will improve Outcome: Progressing Goal: Cardiovascular complication will be avoided Outcome: Progressing   Problem: Activity: Goal: Risk for activity intolerance will decrease Outcome: Progressing   Problem: Nutrition: Goal: Adequate nutrition will be maintained Outcome: Progressing   Problem: Coping: Goal: Level of anxiety will decrease Outcome: Progressing   Problem: Elimination: Goal: Will not experience complications related to bowel motility Outcome: Progressing Goal: Will not experience complications related to urinary retention Outcome: Progressing   Problem: Pain Managment: Goal: General experience of comfort will improve Outcome: Progressing   Problem: Safety: Goal: Ability to remain free from injury will improve Outcome: Progressing   Problem: Skin Integrity: Goal: Risk for impaired skin integrity will decrease Outcome: Progressing

## 2021-03-12 NOTE — Progress Notes (Signed)
Physical Therapy Treatment Patient Details Name: Katelyn Arroyo MRN: [id number] DOB: October 19, 1928 Today's Date: 03/12/2021   History of Present Illness Pt is an 87 y/o F with PMH: IDDM, hypothyroidism, dementia, and depression. Pt presents from facility [facility]) after fall/syncopal episode, found to have minimally displaced right superior pubic ramus fracture and right sacral ala fracture.    PT Comments    Pt resting in bed upon PT arrival; agreeable to PT session.  Max assist x1 with bed mobility.  Pt able to briefly sit on edge of bed a few times SBA with cueing but then pt would lean towards R onto R elbow and then close her eyes (pt eventually requesting to go back to bed to rest d/t fatigue).  Pt assisted back to bed and repositioned for comfort.  NT entering pt's room end of session to assist pt with clean-up d/t bowel incontinence.  Will continue to focus on strengthening and progressive functional mobility per pt tolerance.   Recommendations for follow up therapy are one component of a multi-disciplinary discharge planning process, led by the attending physician.  Recommendations may be updated based on patient status, additional functional criteria and insurance authorization.  Follow Up Recommendations  Skilled nursing-short term rehab (<3 hours/day)     Assistance Recommended at Discharge Frequent or constant Supervision/Assistance  Equipment Recommendations  Other (comment) (TBD at next facility)    Recommendations for Other Services       Precautions / Restrictions Precautions Precautions: Fall Restrictions Weight Bearing Restrictions: Yes RLE Weight Bearing: Weight bearing as tolerated     Mobility  Bed Mobility Overal bed mobility: Needs Assistance Bed Mobility: Supine to Sit;Sit to Supine     Supine to sit: Max assist;HOB elevated Sit to supine: Max assist;HOB elevated   General bed mobility comments: assist for trunk and B LE's; vc's for  sequencing/technique    Transfers                   General transfer comment: unable to maintain sitting upright position to attempt    Ambulation/Gait                 Stairs             Wheelchair Mobility    Modified Rankin (Stroke Patients Only)       Balance Overall balance assessment: Needs assistance Sitting-balance support: Feet supported;Bilateral upper extremity supported Sitting balance-Leahy Scale: Poor Sitting balance - Comments: pt able to sit briefly close SBA but then would lean onto R elbow and close her eyes Postural control: Right lateral lean                                  Cognition Arousal/Alertness: Awake/alert Behavior During Therapy: WFL for tasks assessed/performed Overall Cognitive Status: History of cognitive impairments - at baseline                                 General Comments: Oriented to self only; inconsistent with following simple commands.        Exercises Total Joint Exercises Long Arc Quad: AAROM;Strengthening;Both;5 reps;Seated    General Comments        Pertinent Vitals/Pain Pain Assessment: Faces Faces Pain Scale: Hurts whole lot Pain Location: yells out but unable to verbalize pain location Pain Descriptors / Indicators: Aching;Crying;Guarding;Discomfort Pain Intervention(s): Limited activity within patient's  tolerance;Monitored during session;Repositioned Vitals (HR and O2 on room air) stable and WFL throughout treatment session.    Home Living                          Prior Function            PT Goals (current goals can now be found in the care plan section) Acute Rehab PT Goals Patient Stated Goal: to walk PT Goal Formulation: With patient Time For Goal Achievement: 03/19/21 Potential to Achieve Goals: Fair Progress towards PT goals: Progressing toward goals    Frequency    Min 2X/week      PT Plan Current plan remains appropriate     Co-evaluation              AM-PAC PT "6 Clicks" Mobility   Outcome Measure  Help needed turning from your back to your side while in a flat bed without using bedrails?: A Little Help needed moving from lying on your back to sitting on the side of a flat bed without using bedrails?: A Lot Help needed moving to and from a bed to a chair (including a wheelchair)?: Total Help needed standing up from a chair using your arms (e.g., wheelchair or bedside chair)?: Total Help needed to walk in hospital room?: Total Help needed climbing 3-5 steps with a railing? : Total 6 Click Score: 9    End of Session Equipment Utilized During Treatment: Gait belt Activity Tolerance: Patient limited by fatigue Patient left: in bed;with call bell/phone within reach;with bed alarm set Nurse Communication: Mobility status;Precautions (NT notified pt needing clean-up d/t bowel incontinence) PT Visit Diagnosis: History of falling (Z91.81);Muscle weakness (generalized) (M62.81);Difficulty in walking, not elsewhere classified (R26.2);Pain Pain - Right/Left: Right Pain - part of body: Hip     Time: 7903-8333 PT Time Calculation (min) (ACUTE ONLY): 23 min  Charges:  $Therapeutic Activity: 23-37 mins                     Leitha Bleak, PT 03/12/21, 5:28 PM

## 2021-03-13 DIAGNOSIS — S32511A Fracture of superior rim of right pubis, initial encounter for closed fracture: Secondary | ICD-10-CM | POA: Diagnosis not present

## 2021-03-13 LAB — CBC
HCT: 32.3 % — ABNORMAL LOW (ref 36.0–46.0)
Hemoglobin: 10.8 g/dL — ABNORMAL LOW (ref 12.0–15.0)
MCH: 29.6 pg (ref 26.0–34.0)
MCHC: 33.4 g/dL (ref 30.0–36.0)
MCV: 88.5 fL (ref 80.0–100.0)
Platelets: 331 10*3/uL (ref 150–400)
RBC: 3.65 MIL/uL — ABNORMAL LOW (ref 3.87–5.11)
RDW: 13.6 % (ref 11.5–15.5)
WBC: 12 10*3/uL — ABNORMAL HIGH (ref 4.0–10.5)
nRBC: 0 % (ref 0.0–0.2)

## 2021-03-13 LAB — BASIC METABOLIC PANEL
Anion gap: 10 (ref 5–15)
BUN: 52 mg/dL — ABNORMAL HIGH (ref 8–23)
CO2: 24 mmol/L (ref 22–32)
Calcium: 8.4 mg/dL — ABNORMAL LOW (ref 8.9–10.3)
Chloride: 102 mmol/L (ref 98–111)
Creatinine, Ser: 2.34 mg/dL — ABNORMAL HIGH (ref 0.44–1.00)
GFR, Estimated: 19 mL/min — ABNORMAL LOW (ref >60)
Glucose, Bld: 200 mg/dL — ABNORMAL HIGH (ref 70–99)
Potassium: 4.4 mmol/L (ref 3.5–5.1)
Sodium: 136 mmol/L (ref 135–145)

## 2021-03-13 LAB — GLUCOSE, CAPILLARY
Glucose-Capillary: 198 mg/dL — ABNORMAL HIGH (ref 70–99)
Glucose-Capillary: 235 mg/dL — ABNORMAL HIGH (ref 70–99)
Glucose-Capillary: 145 mg/dL — ABNORMAL HIGH (ref 70–99)
Glucose-Capillary: 277 mg/dL — ABNORMAL HIGH (ref 70–99)

## 2021-03-13 LAB — MAGNESIUM: Magnesium: 2.1 mg/dL (ref 1.7–2.4)

## 2021-03-13 NOTE — Progress Notes (Signed)
Physical Therapy Treatment Patient Details Name: Katelyn Arroyo MRN: [id number] DOB: 1928-12-12 Today's Date: 03/13/2021   History of Present Illness Pt is an 87 y/o F with PMH: IDDM, hypothyroidism, dementia, and depression. Pt presents from facility [facility]) after fall/syncopal episode, found to have minimally displaced right superior pubic ramus fracture and right sacral ala fracture.    PT Comments    Pt seen for pt treatment this date. Pt asleep in room upon entry, awakens easily, has little to offer verbally, but responds to questioning appropriately. Pt has no recollection of sustaining a fall prior to admission. Pt calm and cooperative throughout. Pt appears comfortable in exercises overall, but has weakness on RLE that requires assist from author for full available ROM. Pt made comfortable at end of session, but HOB decreased from 50 to 30 degrees due to continuous drop in BP throughout session at 50 degrees.   Orthostatic VS for the past 24 hrs:  BP- Lying Pulse- Lying BP- Sitting Pulse- Sitting  03/13/21 1610 134/72 67 115/56 67  *seated BP assessed Q3-4 minutes (3x total); BP continues to gradually decline over the course of session. HR remains unchanged with each value, strong correlation to dysautonomia or neurogenic orthostasis.     Recommendations for follow up therapy are one component of a multi-disciplinary discharge planning process, led by the attending physician.  Recommendations may be updated based on patient status, additional functional criteria and insurance authorization.  Follow Up Recommendations  Skilled nursing-short term rehab (<3 hours/day)     Assistance Recommended at Discharge Frequent or constant Supervision/Assistance  Equipment Recommendations  None recommended by PT    Recommendations for Other Services       Precautions / Restrictions Precautions Precautions: Fall Restrictions Weight Bearing Restrictions: Yes RLE Weight Bearing: Weight  bearing as tolerated     Mobility  Bed Mobility Overal bed mobility:  (deferred due to sleepiness, orthostasis and assymptomatic presenstation)                  Transfers                        Ambulation/Gait                 Stairs             Wheelchair Mobility    Modified Rankin (Stroke Patients Only)       Balance                                            Cognition Arousal/Alertness: Awake/alert Behavior During Therapy: WFL for tasks assessed/performed Overall Cognitive Status: History of cognitive impairments - at baseline                                 General Comments: Oriented to self only; inconsistent with following simple commands.        Exercises Other Exercises Other Exercises: SAQ 1x15, 2 sets, pt unable to attend to counting reps Other Exercises: short sitting, alterante marching 1x10, modA of RLE    General Comments        Pertinent Vitals/Pain Pain Assessment: Faces Faces Pain Scale: Hurts a little bit    Home Living  Prior Function            PT Goals (current goals can now be found in the care plan section) Acute Rehab PT Goals Patient Stated Goal: to walk PT Goal Formulation: With patient Time For Goal Achievement: 03/19/21 Potential to Achieve Goals: Fair Progress towards PT goals: Not progressing toward goals - comment    Frequency    Min 2X/week      PT Plan Current plan remains appropriate    Co-evaluation              AM-PAC PT "6 Clicks" Mobility   Outcome Measure  Help needed turning from your back to your side while in a flat bed without using bedrails?: A Little Help needed moving from lying on your back to sitting on the side of a flat bed without using bedrails?: A Lot Help needed moving to and from a bed to a chair (including a wheelchair)?: Total Help needed standing up from a chair using your arms  (e.g., wheelchair or bedside chair)?: Total Help needed to walk in hospital room?: Total Help needed climbing 3-5 steps with a railing? : Total 6 Click Score: 9    End of Session   Activity Tolerance: Patient limited by fatigue Patient left: in bed;with call bell/phone within reach;with bed alarm set Nurse Communication: Mobility status;Precautions PT Visit Diagnosis: History of falling (Z91.81);Muscle weakness (generalized) (M62.81);Difficulty in walking, not elsewhere classified (R26.2);Pain Pain - Right/Left: Right Pain - part of body: Hip     Time: 4097-3532 PT Time Calculation (min) (ACUTE ONLY): 24 min  Charges:  $Therapeutic Exercise: 8-22 mins $Therapeutic Activity: 8-22 mins                    4:54 PM, 03/13/21 Etta Grandchild, PT, DPT Physical Therapist - [facility]  [phone] ([place])    Katelyn Arroyo 03/13/2021, 4:46 PM

## 2021-03-13 NOTE — Progress Notes (Signed)
PROGRESS NOTE    Katelyn Arroyo  NFA:[id number] DOB: 03-27-1929 DOA: 03/04/2021 PCP: [facility]  145A/145A-AA   Assessment & Plan:   Principal Problem:   Syncope Active Problems:   Essential hypertension   Dementia (HCC)   COPD (chronic obstructive pulmonary disease) (HCC)   Hyperlipidemia   Closed fracture of superior pubic ramus (HCC)   Closed sacral fracture (HCC)   Compression fracture of T2 vertebra (HCC)   COVID-19 virus infection   Insulin dependent type 2 diabetes mellitus ([place])   87 y/o F w/ PMH of dementia, insulin-dependent type 2 diabetes with admission for DKA earlier this year, hypothyroidism, depression who presented to the ED from SNF after a syncopal episode.  She was reportedly found unresponsive in her shower facedown by staff.  Patient is poor historian due to dementia and limited history was provided by SNF staff per EMS.  She was reportedly hypotensive with EMS and given half liter bolus IV fluids.   On admission was complaining of abdominal low back pain without radiation.     Patient was found to be positive for COVID-19 but denying respiratory symptoms or feeling sick.     Imaging studies revealed minimally displaced superior pubic ramus fracture and right sacral ala fracture.  Orthopedic surgery was consulted.     Syncopal episode  -etiology unclear given very limited history.  Differential includes hypoglycemia, vasovagal syncope, arrhythmia versus neurogenic.  May also have had a fall first followed by syncope. Head CT without acute intracranial findings. Neck CT without acute C-spine fracture. Pelvic and sacral fractures as below. Evaluation: Carotid ultrasound negative. Echo with EF greater than 46%, grade 1 diastolic dysfunction, mild LVH   Hyperkalemia -resolved --resolved with Lokelma x1 --monitor   Right superior right ramus fracture Right sacral ala fracture  -likely sustained during the fall --orthopedic surgery  consulted, rec Conservative management with pain control Plan: --PT/OT rec SNF  --Weightbearing as tolerated --will need to follow up with ortho in [place]  Insulin-dependent type 2 diabetes, poorly controlled --A1c 9.1 --cont glargine 15u daily --SSI TID  COVID-19 infection, asymptomatic --No COVID-specific treatment unless symptoms emerge --need 10-day isolation before going to SNF, will be out of isolation on 11/4   CKD -likely stage IV renal function at baseline unclear. --oral hydration  Hypothyroidism  - Continue Synthroid   Depression  -continue home Zoloft  Dementia  -continue Namenda and Aricept  Pulmonary nodule  -7 mm incidental finding on CT scan.  Repeat CT in 6 to 12 months per radiology guidelines.  Outpatient follow-up.   DVT prophylaxis: Heparin SQ Code Status: DNR  Family Communication:  Level of care: Med-Surg Dispo:   The patient is from: ALF Anticipated d/c is to: daughter is arranging for medical transport to take pt to a facility in [place] when pt is out of isolation Anticipated d/c date is: 11/4, 10 pm by medical transport  Patient currently is medically ready to d/c.   Subjective and Interval History:  Reported feeling better.  Reported eating ok.  No new complaints.  Looking forward to going to [place] to be near her daughter.   Objective: Vitals:   03/12/21 2134 03/13/21 0630 03/13/21 0802 03/13/21 1241  BP: (!) 109/43 130/61 130/63 135/71  Pulse: 73 71 72 63  Resp:  20 16 16   Temp:  98.3 F (36.8 C) 97.7 F (36.5 C) 97.9 F (36.6 C)  TempSrc:      SpO2:  98% 98% 98%  Weight:  Height:        Intake/Output Summary (Last 24 hours) at 03/13/2021 1722 Last data filed at 03/13/2021 1100 Gross per 24 hour  Intake 483 ml  Output 1100 ml  Net -617 ml    Filed Weights   03/10/21 0500 03/11/21 0500 03/12/21 0550  Weight: 63 kg 60.7 kg 61.2 kg    Examination:   Constitutional: NAD, AAOx3 HEENT: conjunctivae and lids  normal, EOMI CV: No cyanosis.   RESP: normal respiratory effort, on RA Neuro: II - XII grossly intact.   Psych: better mood and affect today.  Appropriate judgement and reason   Data Reviewed: I have personally reviewed following labs and imaging studies  CBC: Recent Labs  Lab 03/09/21 0506 03/10/21 0453 03/11/21 0622 03/12/21 0437 03/13/21 0521  WBC 7.3 8.3 13.9* 13.7* 12.0*  HGB 9.9* 10.3* 10.9* 10.4* 10.8*  HCT 29.1* 30.9* 33.7* 30.7* 32.3*  MCV 87.1 86.6 88.9 86.7 88.5  PLT 217 256 343 296 035   Basic Metabolic Panel: Recent Labs  Lab 03/09/21 0506 03/10/21 0453 03/11/21 0622 03/12/21 0437 03/13/21 0521  NA 133* 135 135 134* 136  K 4.7 4.7 4.6 4.5 4.4  CL 100 103 103 101 102  CO2 24 24 21* 21* 24  GLUCOSE 242* 260* 133* 243* 200*  BUN 36* 40* 42* 46* 52*  CREATININE 2.25* 2.07* 2.14* 2.21* 2.34*  CALCIUM 8.3* 8.7* 8.7* 8.7* 8.4*  MG 1.9 2.0 1.9 1.9 2.1   GFR: Estimated Creatinine Clearance: 14.1 mL/min (A) (by C-G formula based on SCr of 2.34 mg/dL (H)). Liver Function Tests: No results for input(s): AST, ALT, ALKPHOS, BILITOT, PROT, ALBUMIN in the last 168 hours.  No results for input(s): LIPASE, AMYLASE in the last 168 hours. No results for input(s): AMMONIA in the last 168 hours. Coagulation Profile: No results for input(s): INR, PROTIME in the last 168 hours.  Cardiac Enzymes: No results for input(s): CKTOTAL, CKMB, CKMBINDEX, TROPONINI in the last 168 hours. BNP (last 3 results) No results for input(s): PROBNP in the last 8760 hours. HbA1C: No results for input(s): HGBA1C in the last 72 hours. CBG: Recent Labs  Lab 03/12/21 1516 03/12/21 2055 03/13/21 0804 03/13/21 1245 03/13/21 1714  GLUCAP 85 182* 198* 235* 145*   Lipid Profile: No results for input(s): CHOL, HDL, LDLCALC, TRIG, CHOLHDL, LDLDIRECT in the last 72 hours. Thyroid Function Tests: No results for input(s): TSH, T4TOTAL, FREET4, T3FREE, THYROIDAB in the last 72 hours. Anemia  Panel: No results for input(s): VITAMINB12, FOLATE, FERRITIN, TIBC, IRON, RETICCTPCT in the last 72 hours. Sepsis Labs: No results for input(s): PROCALCITON, LATICACIDVEN in the last 168 hours.   Recent Results (from the past 240 hour(s))  Blood culture (routine single)     Status: None   Collection Time: 03/04/21  2:34 PM   Specimen: BLOOD  Result Value Ref Range Status   Specimen Description BLOOD BLOOD LEFT HAND  Final   Special Requests   Final    BOTTLES DRAWN AEROBIC AND ANAEROBIC Blood Culture adequate volume   Culture   Final    NO GROWTH 5 DAYS Performed at [facility], [address] [place], [place] [address]    Report Status 03/09/2021 FINAL  Final  Resp Panel by RT-PCR (Flu A&B, Covid) Nasopharyngeal Swab     Status: Abnormal   Collection Time: 03/04/21  5:40 PM   Specimen: Nasopharyngeal Swab; Nasopharyngeal(NP) swabs in vial transport medium  Result Value Ref Range Status   SARS Coronavirus 2  by RT PCR POSITIVE (A) NEGATIVE Final    Comment: RESULT CALLED TO, READ BACK BY AND VERIFIED WITH: GIORGIA HAHLE AT 7989 ON 03/04/21 BY SS (NOTE) SARS-CoV-2 target nucleic acids are DETECTED.  The SARS-CoV-2 RNA is generally detectable in upper respiratory specimens during the acute phase of infection. Positive results are indicative of the presence of the identified virus, but do not rule out bacterial infection or co-infection with other pathogens not detected by the test. Clinical correlation with patient history and other diagnostic information is necessary to determine patient infection status. The expected result is Negative.  Fact Sheet for Patients: EntrepreneurPulse.com.au  Fact Sheet for Healthcare Providers: IncredibleEmployment.be  This test is not yet approved or cleared by the [place] FDA and  has been authorized for detection and/or diagnosis of SARS-CoV-2 by FDA under an Emergency Use Authorization  (EUA).  This EUA will remain in effect (meaning this test  can be used) for the duration of  the COVID-19 declaration under Section 564(b)(1) of the Act, 21 U.S.C. section 360bbb-3(b)(1), unless the authorization is terminated or revoked sooner.     Influenza A by PCR NEGATIVE NEGATIVE Final   Influenza B by PCR NEGATIVE NEGATIVE Final    Comment: (NOTE) The Xpert Xpress SARS-CoV-2/FLU/RSV plus assay is intended as an aid in the diagnosis of influenza from Nasopharyngeal swab specimens and should not be used as a sole basis for treatment. Nasal washings and aspirates are unacceptable for Xpert Xpress SARS-CoV-2/FLU/RSV testing.  Fact Sheet for Patients: EntrepreneurPulse.com.au  Fact Sheet for Healthcare Providers: IncredibleEmployment.be  This test is not yet approved or cleared by the [place] FDA and has been authorized for detection and/or diagnosis of SARS-CoV-2 by FDA under an Emergency Use Authorization (EUA). This EUA will remain in effect (meaning this test can be used) for the duration of the COVID-19 declaration under Section 564(b)(1) of the Act, 21 U.S.C. section 360bbb-3(b)(1), unless the authorization is terminated or revoked.  Performed at [facility], [address], [place], [place] [address]   Urine Culture     Status: Abnormal   Collection Time: 03/04/21  5:40 PM   Specimen: In/Out Cath Urine  Result Value Ref Range Status   Specimen Description   Final    IN/OUT CATH URINE Performed at [facility], [address], [place], [place] [address]    Special Requests   Final    NONE Performed at [facility], [address] [place], [place] [address]    Culture (A)  Final    <10,000 COLONIES/mL INSIGNIFICANT GROWTH Performed at [facility], [place] [address] [place], [place] [address]    Report Status 03/05/2021 FINAL  Final  MRSA Next Gen by PCR, Nasal     Status: None    Collection Time: 03/06/21  4:40 AM   Specimen: Nasal Mucosa; Nasal Swab  Result Value Ref Range Status   MRSA by PCR Next Gen NOT DETECTED NOT DETECTED Final    Comment: (NOTE) The GeneXpert MRSA Assay (FDA approved for NASAL specimens only), is one component of a comprehensive MRSA colonization surveillance program. It is not intended to diagnose MRSA infection nor to guide or monitor treatment for MRSA infections. Test performance is not FDA approved in patients less than 47 years old. Performed at [facility], [address] [place],  [address]       Radiology Studies: No results found.   Scheduled Meds:  donepezil  10 mg Oral QHS  heparin  5,000 Units Subcutaneous Q8H   insulin aspart  0-9 Units Subcutaneous TID WC   insulin glargine-yfgn  15 Units Subcutaneous Daily   levothyroxine  125 mcg Oral Q0600   memantine  10 mg Oral BID   metoprolol tartrate  50 mg Oral BID   polyethylene glycol  17 g Oral BID   sertraline  25 mg Oral QHS   sodium chloride flush  3 mL Intravenous Q12H   Continuous Infusions:   LOS: 8 days     Enzo Bi, MD [facility] If 7PM-7AM, please contact night-coverage 03/13/2021, 5:22 PM

## 2021-03-13 NOTE — TOC Progression Note (Signed)
Transition of Care [facility]) - Progression Note    Patient Details  Name: Katelyn Arroyo MRN: [id number] Date of Birth: 05-Jul-1928  Transition of Care [facility]) CM/SW [place], RN Phone Number: 03/13/2021, 8:54 AM  Clinical Narrative:   Received a call from [facility], they are scheduled to transport the patient on 11/4 at 10 PM to [place] to SNF, they requested a MAR and face sheet to be faxed to [phone], I faxed the requested documents, they stated that they will need 18 hours worth of medications for the patient to transport, They requested a neg covid test, I explained that due to testing positive on 10/24 the test would not be negative, she made a note, I provided the nursing desk phone number so that the [place] could call for report from the bedside nurse         Expected Discharge Plan and Services                                                 Social Determinants of Health (SDOH) Interventions    Readmission Risk Interventions No flowsheet data found.

## 2021-03-13 NOTE — Progress Notes (Signed)
Occupational Therapy Treatment Patient Details Name: Katelyn Arroyo MRN: [id number] DOB: Sep 16, 1928 Today's Date: 03/13/2021   History of present illness Pt is an 87 y/o F with PMH: IDDM, hypothyroidism, dementia, and depression. Pt presents from facility [facility]) after fall/syncopal episode, found to have minimally displaced right superior pubic ramus fracture and right sacral ala fracture.   OT comments  Pt seen for OT treatment this date to f/u re: safety with ADLs/ADL mobility. Pt requires MOD A to come to sitting and tolerates EOB sitting with F balance. Requires MIN/MOD A for STS with RW and tolerates 2 stands. She requires MAX A for peri care in standing. Pt able to follow most simple one step cues. She requires increased processing time and cues for safety throughout. She is known to be orthostatic, but does not c/o dizziness throughout session. That said, this could be r/t cognition. She only tolerates standing ~20 seconds at a time. Pt does c/o pain on her bottom and her skin is noted to be red and irritated, seemingly 2/2 loose stools. OT assists with peri care and provides clean linens. Pt left with all needs met and in reach. Will continue to follow acutely. Continue to anticipate she will require STR f/u OT services in SNF setting.   Recommendations for follow up therapy are one component of a multi-disciplinary discharge planning process, led by the attending physician.  Recommendations may be updated based on patient status, additional functional criteria and insurance authorization.    Follow Up Recommendations  Skilled nursing-short term rehab (<3 hours/day)    Assistance Recommended at Discharge Frequent or constant Supervision/Assistance  Equipment Recommendations  [facility]    Recommendations for Other Services      Precautions / Restrictions Precautions Precautions: Fall Restrictions Weight Bearing Restrictions: Yes RLE Weight Bearing: Weight bearing as tolerated        Mobility Bed Mobility Overal bed mobility: Needs Assistance Bed Mobility: Supine to Sit;Sit to Supine     Supine to sit: Mod assist Sit to supine: Max assist   General bed mobility comments: increased time    Transfers Overall transfer level: Needs assistance Equipment used: Rolling walker (2 wheels) Transfers: Sit to/from Stand Sit to Stand: Min assist;Mod assist;From elevated surface           General transfer comment: MIN/MOD A for STS with RW     Balance Overall balance assessment: Needs assistance Sitting-balance support: Feet supported;Bilateral upper extremity supported Sitting balance-Leahy Scale: Fair Sitting balance - Comments: G static, but unable to tolerate a challenge Postural control: Right lateral lean Standing balance support: Bilateral upper extremity supported Standing balance-Leahy Scale: Poor Standing balance comment: hands on and UE support                           ADL either performed or assessed with clinical judgement   ADL Overall ADL's : Needs assistance/impaired     Grooming: Sitting;Set up;Wash/dry hands;Wash/dry face;Supervision/safety   Upper Body Bathing: Moderate assistance;Sitting   Lower Body Bathing: Maximal assistance;Total assistance;Sit to/from stand Lower Body Bathing Details (indicate cue type and reason): standing at EOB with RW, MOD A for balance, MAX/TOTAL A for actual completion of task as pt is unable to alterante UE support from [place] Patient Visual Report: No change from baseline  Perception     Praxis      Cognition Arousal/Alertness: Awake/alert Behavior During Therapy: WFL for tasks assessed/performed Overall Cognitive Status: History of cognitive impairments - at baseline                                 General Comments: Oriented to self only; follows most simple one step commands with increased time.          Exercises  Other Exercises Other Exercises: OT facilitates pt participation in bathing and grooming tasks Other Exercises: short sitting, alterante marching 1x10, modA of RLE   Shoulder Instructions       General Comments      Pertinent Vitals/ Pain       Pain Assessment: Faces Faces Pain Scale: Hurts a little bit Pain Location: bottom d/t raw skin from loose stools, notified LPN Pain Descriptors / Indicators: Grimacing;Guarding;Moaning Pain Intervention(s): Limited activity within patient's tolerance;Monitored during session;Repositioned  Home Living                                          Prior Functioning/Environment              Frequency  Min 1X/week        Progress Toward Goals  OT Goals(current goals can now be found in the care plan section)  Progress towards OT goals: Progressing toward goals  Acute Rehab OT Goals Patient Stated Goal: none stated OT Goal Formulation: Patient unable to participate in goal setting Time For Goal Achievement: 03/20/21 Potential to Achieve Goals: [place] Discharge plan remains appropriate;Frequency remains appropriate    Co-evaluation                 AM-PAC OT "6 Clicks" Daily Activity     Outcome Measure   Help from another person eating meals?: A Little Help from another person taking care of personal grooming?: A Little Help from another person toileting, which includes using toliet, bedpan, or urinal?: Total Help from another person bathing (including washing, rinsing, drying)?: A Lot Help from another person to put on and taking off regular upper body clothing?: A Little Help from another person to put on and taking off regular lower body clothing?: A Lot 6 Click Score: 14    End of Session Equipment Utilized During Treatment: Rolling walker (2 wheels)  OT Visit Diagnosis: Other abnormalities of gait and mobility (R26.89);Pain;Unsteadiness on feet (R26.81);Muscle weakness (generalized)  (M62.81);Repeated falls (R29.6) Pain - Right/Left: Right Pain - part of body: Hip   Activity Tolerance Patient limited by pain   Patient Left in bed;with call bell/phone within reach;with bed alarm set   Nurse Communication Patient requests pain meds;Mobility status        Time: 1647-1710 OT Time Calculation (min): 23 min  Charges: OT General Charges $OT Visit: 1 Visit OT Treatments $Self Care/Home Management : 8-22 mins $Therapeutic Activity: 8-22 mins  Gerrianne Scale, MS, OTR/L ascom [phone] 03/13/21, 6:16 PM

## 2021-03-13 NOTE — TOC Progression Note (Signed)
Transition of Care [facility]) - Progression Note    Patient Details  Name: Katelyn Arroyo MRN: [id number] Date of Birth: 1928/08/09  Transition of Care [facility]) CM/SW [place], RN Phone Number: 03/13/2021, 9:26 AM  Clinical Narrative:   Faxed admission order to [phone]         Expected Discharge Plan and Services                                                 Social Determinants of Health (SDOH) Interventions    Readmission Risk Interventions No flowsheet data found.

## 2021-03-14 ENCOUNTER — Other Ambulatory Visit: Payer: Self-pay

## 2021-03-14 ENCOUNTER — Encounter: Payer: Self-pay | Admitting: Internal Medicine

## 2021-03-14 DIAGNOSIS — R55 Syncope and collapse: Secondary | ICD-10-CM | POA: Diagnosis not present

## 2021-03-14 DIAGNOSIS — F0390 Unspecified dementia without behavioral disturbance: Secondary | ICD-10-CM | POA: Diagnosis not present

## 2021-03-14 DIAGNOSIS — S32511A Fracture of superior rim of right pubis, initial encounter for closed fracture: Secondary | ICD-10-CM | POA: Diagnosis not present

## 2021-03-14 LAB — CBC
HCT: 32 % — ABNORMAL LOW (ref 36.0–46.0)
Hemoglobin: 10.3 g/dL — ABNORMAL LOW (ref 12.0–15.0)
MCH: 28.5 pg (ref 26.0–34.0)
MCHC: 32.2 g/dL (ref 30.0–36.0)
MCV: 88.6 fL (ref 80.0–100.0)
Platelets: 367 10*3/uL (ref 150–400)
RBC: 3.61 MIL/uL — ABNORMAL LOW (ref 3.87–5.11)
RDW: 13.2 % (ref 11.5–15.5)
WBC: 8.2 10*3/uL (ref 4.0–10.5)
nRBC: 0 % (ref 0.0–0.2)

## 2021-03-14 LAB — GLUCOSE, CAPILLARY
Glucose-Capillary: 317 mg/dL — ABNORMAL HIGH (ref 70–99)
Glucose-Capillary: 219 mg/dL — ABNORMAL HIGH (ref 70–99)
Glucose-Capillary: 39 mg/dL — CL (ref 70–99)
Glucose-Capillary: 43 mg/dL — CL (ref 70–99)
Glucose-Capillary: 153 mg/dL — ABNORMAL HIGH (ref 70–99)
Glucose-Capillary: 355 mg/dL — ABNORMAL HIGH (ref 70–99)

## 2021-03-14 LAB — BASIC METABOLIC PANEL
Anion gap: 8 (ref 5–15)
BUN: 49 mg/dL — ABNORMAL HIGH (ref 8–23)
CO2: 24 mmol/L (ref 22–32)
Calcium: 8.2 mg/dL — ABNORMAL LOW (ref 8.9–10.3)
Chloride: 102 mmol/L (ref 98–111)
Creatinine, Ser: 2.06 mg/dL — ABNORMAL HIGH (ref 0.44–1.00)
GFR, Estimated: 23 mL/min — ABNORMAL LOW (ref >60)
Glucose, Bld: 327 mg/dL — ABNORMAL HIGH (ref 70–99)
Potassium: 4.6 mmol/L (ref 3.5–5.1)
Sodium: 134 mmol/L — ABNORMAL LOW (ref 135–145)

## 2021-03-14 LAB — MAGNESIUM: Magnesium: 2.2 mg/dL (ref 1.7–2.4)

## 2021-03-14 MED ORDER — INSULIN ASPART 100 UNIT/ML IJ SOLN
2.0000 [IU] | Freq: Three times a day (TID) | INTRAMUSCULAR | Status: DC
  Administered 2021-03-14 – 2021-03-15 (×3): 2 [IU] via SUBCUTANEOUS
  Filled 2021-03-14 (×4): qty 1

## 2021-03-14 MED ORDER — INSULIN GLARGINE-YFGN 100 UNIT/ML ~~LOC~~ SOLN
8.0000 [IU] | Freq: Two times a day (BID) | SUBCUTANEOUS | Status: DC
  Administered 2021-03-14 – 2021-03-15 (×3): 8 [IU] via SUBCUTANEOUS
  Filled 2021-03-14 (×5): qty 0.08

## 2021-03-14 MED ORDER — DEXTROSE 50 % IV SOLN: 1.0000 | Freq: Once | INTRAVENOUS | Status: AC

## 2021-03-14 MED ORDER — METOPROLOL TARTRATE 50 MG PO TABS: 50.0000 mg | ORAL_TABLET | Freq: Two times a day (BID) | ORAL | 0 refills | Status: AC

## 2021-03-14 MED ORDER — DEXTROSE 50 % IV SOLN
INTRAVENOUS | Status: AC
  Administered 2021-03-14: 50 mL
  Filled 2021-03-14: qty 50

## 2021-03-14 NOTE — Discharge Summary (Addendum)
Physician Discharge Summary  Jolyssa Oplinger UTM:[id number] DOB: Dec 31, 1928 DOA: 03/04/2021  PCP: [facility]  Admit date: 03/04/2021 Discharge date: 03/15/21  Admitted From: SNF Disposition:  D/c to SNF in [place]   Recommendations for Outpatient Follow-up:  Follow up with PCP in 1-2 weeks F/u w/ ortho surg in 1-2 weeks  Home Health: no  Equipment/Devices:  Discharge Condition: stable  CODE STATUS: DNR Diet recommendation: Heart Healthy / Carb Modified   Brief/Interim Summary: 87 y/o F w/ PMH of dementia, DM, hypothyroidism, depression who presented after a syncopal episode. Pt is a very poor historian and only oriented to person. Pt does not have any memory of the syncopal episode. The syncopal episode was unwitnessed. As pt's daughter, the pt did not c/o anything earlier in the day and pt's daughter had talked to pt earlier in the day & prior to the syncopal episode. Pt c/o lower back pain that is dull constant w/o radiation. Nothing makes the pain better or worse. The severity is currently 8/10. Pt denies any fever, chills, sweating, cough, chest pain, shortness of breath, nausea, vomiting, abd pain, dysuria, urinary urgency, urinary frequency, diarrhea or constipation.   As per Dr. Billie Ruddy: 87 y/o F w/ PMH of dementia, insulin-dependent type 2 diabetes with admission for DKA earlier this year, hypothyroidism, depression who presented to the ED from SNF after a syncopal episode.  She was reportedly found unresponsive in her shower facedown by staff.  Patient is poor historian due to dementia and limited history was provided by SNF staff per EMS.  She was reportedly hypotensive with EMS and given half liter bolus IV fluids.   On admission was complaining of abdominal low back pain without radiation.     Patient was found to be positive for COVID-19 but denying respiratory symptoms or feeling sick.     Imaging studies revealed minimally displaced superior pubic ramus fracture  and right sacral ala fracture.  Orthopedic surgery was consulted.     As per Dr. Jimmye Norman 11/2-11/3/22: Pt completed her 10 day quarantine and was d/c to SNF in [place]. This was set up by the pt's daughter. [facility] will drive the pt to SNF in [place] starting on 03/15/21 at 10 pm. For more information, please see previous progress/consult notes.    Discharge Diagnoses:  Principal Problem:   Syncope Active Problems:   Essential hypertension   Dementia (HCC)   COPD (chronic obstructive pulmonary disease) (HCC)   Hyperlipidemia   Closed fracture of superior pubic ramus (HCC)   Closed sacral fracture (HCC)   Compression fracture of T2 vertebra (HCC)   COVID-19 virus infection   Insulin dependent type 2 diabetes mellitus ([place])  Syncopal episode: etiology unclear, ddx hypoglycemia, vasovagal syncope, arrhythmia versus neurogenic. CT head without acute intracranial findings. CT neck without acute C-spine fracture. Carotid ultrasound negative. Echo with EF greater than 56%, grade 1 diastolic dysfunction, mild LVH   Hyperkalemia: resolved   Right superior right ramus fracture & right sacral ala fracture: likely sustained during the fall. Weightbearing as tolerated. Will need to follow up with ortho in [place]  DM2: poorly controlled, HbA1c 9.1. Continue on glargine, SSI w/ accuchecks   COVID-19 infection: asymptomatic. Need 10-day isolation before going to SNF, will be out of isolation on 11/4   CKDIV: Cr is labile. Avoid nephrotoxic meds   ACD: likely secondary to CKD. No need for a transfusion currently    Hypothyroidism: continue on home dose of synthroid    Depression: severity unknown.  Continue on home dose of zoloft   Dementia: continue w/ namenda, aricept   Pulmonary nodule: 7 mm incidental finding on CT scan.  Repeat CT in 6 to 12 months per radiology guidelines.  Outpatient follow-up.  Discharge Instructions  Discharge Instructions     Diet - low sodium heart healthy    Complete by: As directed    Diet Carb Modified   Complete by: As directed    Discharge instructions   Complete by: As directed    F/u w/ PCP in 1-2 weeks. F/u w/ ortho surg in 1-2 weeks   Increase activity slowly   Complete by: As directed       Allergies as of 03/14/2021       Reactions   Ceftriaxone         Medication List     TAKE these medications    acetaminophen 325 MG tablet Commonly known as: TYLENOL Take 650 mg by mouth every 6 (six) hours as needed for mild pain.   donepezil 10 MG tablet Commonly known as: ARICEPT Take 10 mg by mouth at bedtime.   insulin aspart 100 UNIT/ML injection Commonly known as: novoLOG Inject 4-10 Units into the skin 3 (three) times daily with meals. Inject 10 units subcutaneously in the morning, 7 units with lunch and 4 units in the evening (give with a snack/meal)   insulin glargine 100 UNIT/ML injection Commonly known as: LANTUS Inject 10 Units into the skin in the morning.   insulin glargine 100 UNIT/ML injection Commonly known as: LANTUS Inject 6 Units into the skin at bedtime.   levothyroxine 137 MCG tablet Commonly known as: SYNTHROID Take 137 mcg by mouth daily before breakfast.   Melatonin 10 MG Tabs Take 10 mg by mouth at bedtime.   memantine 10 MG tablet Commonly known as: NAMENDA Take 10 mg by mouth 2 (two) times daily.   metoprolol tartrate 50 MG tablet Commonly known as: LOPRESSOR Take 1 tablet (50 mg total) by mouth 2 (two) times daily.   senna-docusate 8.6-50 MG tablet Commonly known as: Senokot-S Take 1 tablet by mouth every 12 (twelve) hours as needed for mild constipation.   sertraline 25 MG tablet Commonly known as: ZOLOFT Take 25 mg by mouth at bedtime.        Allergies  Allergen Reactions   Ceftriaxone     Consultations: Ortho surg    Procedures/Studies: CT HEAD WO CONTRAST (5MM)  Result Date: 03/04/2021 CLINICAL DATA:  Head trauma, minor (Age >= 65y) EXAM: CT HEAD WITHOUT  CONTRAST TECHNIQUE: Contiguous axial images were obtained from the base of the skull through the vertex without intravenous contrast. COMPARISON:  None. FINDINGS: Brain: No evidence of acute intracranial hemorrhage or extra-axial collection.No evidence of mass lesion/concern mass effect.The ventricles appear mildly enlarged but proportional to degree of cerebral atrophy.Confluent periventricular and subcortical white matter hypoattenuation, which is nonspecific but likely sequela of chronic small vessel ischemic disease.Mild cerebral atrophy Vascular: Bilateral carotid calcifications. Skull: Normal. Negative for fracture or focal lesion. Sinuses/Orbits: No acute finding. Other: None. IMPRESSION: No acute intracranial abnormality. Sequela of chronic small vessel ischemic disease and cerebral atrophy. Electronically Signed   By: Maurine Simmering M.D.   On: 03/04/2021 14:24   CT Cervical Spine Wo Contrast  Result Date: 03/04/2021 CLINICAL DATA:  Neck trauma (Age >= 65y) EXAM: CT CERVICAL SPINE WITHOUT CONTRAST TECHNIQUE: Multidetector CT imaging of the cervical spine was performed without intravenous contrast. Multiplanar CT image reconstructions were also generated. COMPARISON:  None.  FINDINGS: Alignment: Straightening of the normal cervical lordosis. Trace anterolisthesis at C4-C5. Skull base and vertebrae: There is no evidence of acute cervical spine fracture. Mild anterior height loss of C7 which is likely chronic. There is no aggressive osseous lesion. Soft tissues and spinal canal: No prevertebral fluid or swelling. No visible canal hematoma. Disc levels: Multilevel degenerative disc disease, severe at C5-C6 and moderate at C6-C7 and C7-T1. There is multilevel facet arthropathy bilaterally. Upper chest: Please see same-day separately dictated CT of the chest. Other: Atrophic thyroid. IMPRESSION: No acute cervical spine fracture. Mild anterior height loss of C7 which is favored to be chronic. Multilevel  degenerative disc disease, worst from C5-T1. Electronically Signed   By: Maurine Simmering M.D.   On: 03/04/2021 14:28   DG Pelvis Portable  Result Date: 03/04/2021 CLINICAL DATA:  Altered mental status, questionable sepsis EXAM: PORTABLE PELVIS 1-2 VIEWS COMPARISON:  Portable exam 1317 hours without priors for comparison FINDINGS: Osseous demineralization. Hip and SI joint spaces preserved. No acute fracture, dislocation, or bone destruction. Scattered atherosclerotic calcifications. IMPRESSION: No acute abnormalities. Electronically Signed   By: Lavonia Dana M.D.   On: 03/04/2021 13:52   CT CHEST ABDOMEN PELVIS W CONTRAST  Result Date: 03/04/2021 CLINICAL DATA:  F fall, unresponsive all moderate sponsored EXAM: CT CHEST, ABDOMEN, AND PELVIS WITH CONTRAST TECHNIQUE: Multidetector CT imaging of the chest, abdomen and pelvis was performed following the standard protocol during bolus administration of intravenous contrast. CONTRAST:  38mL OMNIPAQUE IOHEXOL 300 MG/ML  SOLN COMPARISON:  None. FINDINGS: CT CHEST FINDINGS Cardiovascular: Normal cardiac size.No pericardial disease.Coronary artery and mitral annular calcifications.Mild atherosclerotic calcifications of the thoracic aorta. Mediastinum/Nodes: No lymphadenopathy.Atrophic thyroid.Patulous esophagus with intraluminal gas and fluid. Lungs/Pleura: There is a 7 mm right apical ground-glass nodular opacity (series 4, image 24). There is no other focal airspace disease. There is atelectasis in the lingula and right middle lobe. Scattered lung scarring. No pleural effusion or pneumothorax. Musculoskeletal: Age-indeterminate central compression deformity of T2.Multilevel degenerative changes of the spine. Osteopenia. No suspicious lytic or blastic lesions. CT ABDOMEN PELVIS FINDINGS Hepatobiliary: No hepatic injury or perihepatic hematoma. Multiple calcified hepatic granulomas. The gallbladder is unremarkable. Pancreas: Unremarkable. No pancreatic ductal dilatation  or surrounding inflammatory changes. Spleen: No splenic injury or perisplenic hematoma. Multiple splenic granulomas. Adrenals/Urinary Tract: No adrenal hemorrhage or renal injury identified. Bladder is unremarkable. No renal laceration. No hydronephrosis or nephrolithiasis. Small left renal cyst, subcentimeter. Bladder is mildly distended with wall thickening likely related to underdistention. Stomach/Bowel: The stomach is within normal limits. There is no evidence of bowel obstruction. The appendix is normal. No acute inflammatory process. No evidence of acute bowel injury. Sigmoid diverticulosis. Vascular/Lymphatic: Aorta iliac atherosclerotic calcifications. No AAA. No lymphadenopathy. Reproductive: Unremarkable. Other: No abdominal wall hernia or abnormality. No abdominopelvic ascites. Musculoskeletal: There is a nondisplaced right superior pubic ramus/pubic body fracture. Nondisplaced right sacral fracture in zone 1 (series 2, images 91-93) prior L1 vertebral plasty for an L1 burst fracture, as described on separately dictated lumbar spine CT. There is multilevel degenerative disc disease, worst at L2-L3 and L3-L4. IMPRESSION: Nondisplaced right superior pubic ramus/pubic body fracture. Nondisplaced right sacral fracture in zone 1. Age-indeterminate central compression deformity of T2, as described on separately dictated thoracic spine CT. No evidence of acute solid organ injury. Chronic findings as described above. 7 mm right apical ground-glass pulmonary nodule, which is likely infectious/inflammatory. Recommend a non-contrast Chest CT at 6-12 months to confirm persistence, then additional non-contrast Chest CTs every 2 years until  5 years. If nodule grows or develops solid component(s), consider resection. These guidelines do not apply to immunocompromised patients and patients with cancer. Follow up in patients with significant comorbidities as clinically warranted. Reference: Radiology. 2017;  284(1):228-43. Electronically Signed   By: Maurine Simmering M.D.   On: 03/04/2021 14:44   US Carotid Bilateral  Result Date: 03/04/2021 CLINICAL DATA:  Syncope. Former smoker. Diabetic. Hyperlipidemia. Hypertension. EXAM: BILATERAL CAROTID DUPLEX ULTRASOUND TECHNIQUE: Pearline Cables scale imaging, color Doppler and duplex ultrasound were performed of bilateral carotid and vertebral arteries in the neck. COMPARISON:  None. FINDINGS: Criteria: Quantification of carotid stenosis is based on velocity parameters that correlate the residual internal carotid diameter with NASCET-based stenosis levels, using the diameter of the distal internal carotid lumen as the denominator for stenosis measurement. The following velocity measurements were obtained: RIGHT ICA: 74 cm/sec CCA: 60 cm/sec SYSTOLIC ICA/CCA RATIO:  1.2 ECA: 132 cm/sec LEFT ICA: 89 cm/sec CCA: 64 cm/sec SYSTOLIC ICA/CCA RATIO:  1.4 ECA: 153 cm/sec RIGHT CAROTID ARTERY: Mild to moderate calcified plaque. RIGHT VERTEBRAL ARTERY:  Antegrade flow. LEFT CAROTID ARTERY:  Mild to moderate calcified plaque. LEFT VERTEBRAL ARTERY:  Antegrade flow. IMPRESSION: No severe stenosis or occlusion identified. Electronically Signed   By: Iven Finn M.D.   On: 03/04/2021 20:57   CT T-SPINE NO CHARGE  Result Date: 03/04/2021 CLINICAL DATA:  Fall EXAM: CT Thoracic and Lumbar spine without contrast TECHNIQUE: Multiplanar CT images of the thoracic and lumbar spine were reconstructed from contemporary CT of the Chest, Abdomen, and Pelvis CONTRAST:  No dissection COMPARISON:  None FINDINGS: CT THORACIC SPINE FINDINGS Alignment: Physiologic Vertebrae: There is an age-indeterminate central compression deformity of T2 with approximately 30% height loss. No bony retropulsion. No other evidence of thoracic spine fracture. Diffuse osteopenia. No aggressive osseous lesion/suspicious lytic or blastic lesions. Paraspinal and other soft tissues: Reported separately. Disc levels: Multilevel  degenerate endplate changes and disc disease, most prominent at T2-T3. C7-T1 degenerative disc disease and facet arthropathy. CT LUMBAR SPINE FINDINGS Segmentation: Normal Alignment: Trace anterolisthesis at L3-L4. Vertebrae: Osteopenia. Prior L1 burst fracture with postprocedural changes of vertebroplasty. There is 5 mm bony retropulsion of the superior endplate which is presumably chronic. No priors for comparison. No other evidence of fracture. Paraspinal and other soft tissues: Reported separately Disc levels: There is multilevel degenerative disc disease, most severe at L2-L3 and L3-L4. There is moderate multilevel facet arthropathy. There is multilevel disc bulging with likely very degrees of mild-to-moderate spinal canal and neural foraminal narrowing. IMPRESSION: THORACIC SPINE CT Age-indeterminate central compression deformity of T2 with approximately 30% height loss. No bony retropulsion. Multilevel degenerative disc disease, most prominent at T2-T3. LUMBAR SPINE CT Postprocedural changes of vertebroplasty for a prior L1 burst fracture. There is 5 mm bony retropulsion of the superior endplate which is presumably chronic, no priors for comparison. No other lumbar spine fracture. Multilevel degenerative disc disease and facet arthropathy with disc bulging, most severe at L2-L3 and L3-L4. Likely varying degrees of mild to moderate spinal canal and neural foraminal narrowing. Electronically Signed   By: Maurine Simmering M.D.   On: 03/04/2021 14:20   CT L-SPINE NO CHARGE  Result Date: 03/04/2021 CLINICAL DATA:  Fall EXAM: CT Thoracic and Lumbar spine without contrast TECHNIQUE: Multiplanar CT images of the thoracic and lumbar spine were reconstructed from contemporary CT of the Chest, Abdomen, and Pelvis CONTRAST:  No dissection COMPARISON:  None FINDINGS: CT THORACIC SPINE FINDINGS Alignment: Physiologic Vertebrae: There is an age-indeterminate central compression deformity  of T2 with approximately 30% height  loss. No bony retropulsion. No other evidence of thoracic spine fracture. Diffuse osteopenia. No aggressive osseous lesion/suspicious lytic or blastic lesions. Paraspinal and other soft tissues: Reported separately. Disc levels: Multilevel degenerate endplate changes and disc disease, most prominent at T2-T3. C7-T1 degenerative disc disease and facet arthropathy. CT LUMBAR SPINE FINDINGS Segmentation: Normal Alignment: Trace anterolisthesis at L3-L4. Vertebrae: Osteopenia. Prior L1 burst fracture with postprocedural changes of vertebroplasty. There is 5 mm bony retropulsion of the superior endplate which is presumably chronic. No priors for comparison. No other evidence of fracture. Paraspinal and other soft tissues: Reported separately Disc levels: There is multilevel degenerative disc disease, most severe at L2-L3 and L3-L4. There is moderate multilevel facet arthropathy. There is multilevel disc bulging with likely very degrees of mild-to-moderate spinal canal and neural foraminal narrowing. IMPRESSION: THORACIC SPINE CT Age-indeterminate central compression deformity of T2 with approximately 30% height loss. No bony retropulsion. Multilevel degenerative disc disease, most prominent at T2-T3. LUMBAR SPINE CT Postprocedural changes of vertebroplasty for a prior L1 burst fracture. There is 5 mm bony retropulsion of the superior endplate which is presumably chronic, no priors for comparison. No other lumbar spine fracture. Multilevel degenerative disc disease and facet arthropathy with disc bulging, most severe at L2-L3 and L3-L4. Likely varying degrees of mild to moderate spinal canal and neural foraminal narrowing. Electronically Signed   By: Maurine Simmering M.D.   On: 03/04/2021 14:20   DG Chest Port 1 View  Result Date: 03/04/2021 CLINICAL DATA:  Altered mental status questionable sepsis EXAM: PORTABLE CHEST 1 VIEW COMPARISON:  Portable exam 1315 hours compared to 01/30/2021 FINDINGS: Enlargement of cardiac  silhouette. Mediastinal contours and pulmonary vascularity normal. Atherosclerotic calcification aorta. Chronic bronchitic changes. Lungs clear. No pulmonary infiltrate, pleural effusion, or pneumothorax. Small calcified granulomata at lower LEFT lung noted. Diffuse osseous demineralization with prior vertebroplasty at question L1. IMPRESSION: Enlargement of cardiac silhouette and chronic bronchitic changes. No acute abnormalities. Aortic Atherosclerosis (ICD10-I70.0). Electronically Signed   By: Lavonia Dana M.D.   On: 03/04/2021 13:52   ECHOCARDIOGRAM COMPLETE  Result Date: 03/05/2021    ECHOCARDIOGRAM REPORT   Patient Name:   Katelyn Arroyo Date of Exam: 03/05/2021 Medical Rec #:  [id number]   Height:       64.0 in Accession #:    [id number]  Weight:       140.0 lb Date of Birth:  1928-06-11   BSA:          1.681 m Patient Age:    87 years    BP:           168/67 mmHg Patient Gender: F           HR:           87 bpm. Exam Location:  [facility] Procedure: 2D Echo, Cardiac Doppler and Color Doppler Indications:     Syncope R55  History:         Patient has no prior history of Echocardiogram examinations.                  Risk Factors:Diabetes.  Sonographer:     Sherrie Sport Referring Phys:  [id number] Wyvonnia Dusky Diagnosing Phys: Nelva Bush MD IMPRESSIONS  1. Left ventricular ejection fraction, by estimation, is >55%. The left ventricle has normal function. Left ventricular endocardial border not optimally defined to evaluate regional wall motion. There is mild left ventricular hypertrophy. Left ventricular diastolic parameters are consistent with Grade I  diastolic dysfunction (impaired relaxation). Elevated left atrial pressure.  2. Right ventricular systolic function is normal. The right ventricular size is normal.  3. The mitral valve was not well visualized. Trivial mitral valve regurgitation. Moderate mitral annular calcification.  4. The aortic valve was not well visualized. Aortic valve regurgitation is  trivial. FINDINGS  Left Ventricle: Left ventricular ejection fraction, by estimation, is >55%. The left ventricle has normal function. Left ventricular endocardial border not optimally defined to evaluate regional wall motion. The left ventricular internal cavity size was  normal in size. There is mild left ventricular hypertrophy. Left ventricular diastolic parameters are consistent with Grade I diastolic dysfunction (impaired relaxation). Elevated left atrial pressure. Right Ventricle: The right ventricular size is normal. Right vetricular wall thickness was not well visualized. Right ventricular systolic function is normal. Left Atrium: Left atrial size was normal in size. Right Atrium: Right atrial size was normal in size. Pericardium: The pericardium was not well visualized. Mitral Valve: The mitral valve was not well visualized. Moderate mitral annular calcification. Trivial mitral valve regurgitation. MV peak gradient, 12.1 mmHg. The mean mitral valve gradient is 4.0 mmHg. Tricuspid Valve: The tricuspid valve is not well visualized. Tricuspid valve regurgitation is trivial. Aortic Valve: The aortic valve was not well visualized. Aortic valve regurgitation is trivial. Aortic valve mean gradient measures 4.0 mmHg. Aortic valve peak gradient measures 8.8 mmHg. Aortic valve area, by VTI measures 3.61 cm. Pulmonic Valve: The pulmonic valve was not well visualized. Pulmonic valve regurgitation is not visualized. No evidence of pulmonic stenosis. Aorta: The aortic root is normal in size and structure. Pulmonary Artery: The pulmonary artery is not well seen. IAS/Shunts: The interatrial septum was not well visualized.  LEFT VENTRICLE PLAX 2D LVIDd:         3.52 cm   Diastology LVIDs:         2.59 cm   LV e' medial:    3.48 cm/s LV PW:         1.21 cm   LV E/e' medial:  19.3 LV IVS:        0.90 cm   LV e' lateral:   3.37 cm/s LVOT diam:     2.00 cm   LV E/e' lateral: 20.0 LV SV:         80 LV SV Index:   48 LVOT Area:      3.14 cm  RIGHT VENTRICLE RV Basal diam:  2.81 cm RV S prime:     14.10 cm/s LEFT ATRIUM             Index        RIGHT ATRIUM           Index LA diam:        3.30 cm 1.96 cm/m   RA Area:     10.10 cm LA Vol (A2C):   42.3 ml 25.16 ml/m  RA Volume:   19.30 ml  11.48 ml/m LA Vol (A4C):   29.6 ml 17.61 ml/m LA Biplane Vol: 36.6 ml 21.77 ml/m  AORTIC VALVE                     PULMONIC VALVE AV Area (Vmax):    3.04 cm      PV Vmax:        0.84 m/s AV Area (Vmean):   3.68 cm      PV Vmean:       60.600 cm/s AV Area (VTI):  3.61 cm      PV VTI:         0.137 m AV Vmax:           148.00 cm/s   PV Peak grad:   2.8 mmHg AV Vmean:          92.200 cm/s   PV Mean grad:   1.5 mmHg AV VTI:            0.223 m       RVOT Peak grad: 4 mmHg AV Peak Grad:      8.8 mmHg AV Mean Grad:      4.0 mmHg LVOT Vmax:         143.00 cm/s LVOT Vmean:        108.000 cm/s LVOT VTI:          0.256 m LVOT/AV VTI ratio: 1.15  AORTA Ao Root diam: 3.00 cm MITRAL VALVE                TRICUSPID VALVE MV Area (PHT): 2.99 cm     TR Peak grad:   17.3 mmHg MV Area VTI:   2.63 cm     TR Vmax:        208.00 cm/s MV Peak grad:  12.1 mmHg MV Mean grad:  4.0 mmHg     SHUNTS MV Vmax:       1.74 m/s     Systemic VTI:  0.26 m MV Vmean:      93.6 cm/s    Systemic Diam: 2.00 cm MV Decel Time: 254 msec     Pulmonic VTI:  0.179 m MV E velocity: 67.30 cm/s MV A velocity: 138.00 cm/s MV E/A ratio:  0.49 Christopher End MD Electronically signed by Nelva Bush MD Signature Date/Time: 03/05/2021/1:07:22 PM    Final    (Echo, Carotid, EGD, Colonoscopy, ERCP)    Subjective: Pt c/o fatigue    Discharge Exam: Vitals:   03/14/21 0548 03/14/21 0857  BP: (!) 132/53 139/76  Pulse: 67 67  Resp: 16 15  Temp: 97.8 F (36.6 C) 98.2 F (36.8 C)  SpO2: 96%    Vitals:   03/13/21 1900 03/13/21 2314 03/14/21 0548 03/14/21 0857  BP:  (!) 154/68 (!) 132/53 139/76  Pulse: (!) 55 69 67 67  Resp: 16 16 16 15   Temp: 97.6 F (36.4 C) 97.7 F (36.5 C)  97.8 F (36.6 C) 98.2 F (36.8 C)  TempSrc: Oral     SpO2:  99% 96%   Weight:      Height:        General: Pt is alert, awake, not in acute distress Cardiovascular: S1/S2 +, no rubs, no gallops Respiratory: CTA bilaterally, no wheezing, no rhonchi Abdominal: Soft, NT, ND, bowel sounds + Extremities: no edema, no cyanosis    The results of significant diagnostics from this hospitalization (including imaging, microbiology, ancillary and laboratory) are listed below for reference.     Microbiology: Recent Results (from the past 240 hour(s))  Blood culture (routine single)     Status: None   Collection Time: 03/04/21  2:34 PM   Specimen: BLOOD  Result Value Ref Range Status   Specimen Description BLOOD BLOOD LEFT HAND  Final   Special Requests   Final    BOTTLES DRAWN AEROBIC AND ANAEROBIC Blood Culture adequate volume   Culture   Final    NO GROWTH 5 DAYS Performed at [facility], [address], [place], [place] [address]  Report Status 03/09/2021 FINAL  Final  Resp Panel by RT-PCR (Flu A&B, Covid) Nasopharyngeal Swab     Status: Abnormal   Collection Time: 03/04/21  5:40 PM   Specimen: Nasopharyngeal Swab; Nasopharyngeal(NP) swabs in vial transport medium  Result Value Ref Range Status   SARS Coronavirus 2 by RT PCR POSITIVE (A) NEGATIVE Final    Comment: RESULT CALLED TO, READ BACK BY AND VERIFIED WITH: GIORGIA HAHLE AT 3295 ON 03/04/21 BY SS (NOTE) SARS-CoV-2 target nucleic acids are DETECTED.  The SARS-CoV-2 RNA is generally detectable in upper respiratory specimens during the acute phase of infection. Positive results are indicative of the presence of the identified virus, but do not rule out bacterial infection or co-infection with other pathogens not detected by the test. Clinical correlation with patient history and other diagnostic information is necessary to determine patient infection status. The expected result is Negative.  Fact Sheet for  Patients: EntrepreneurPulse.com.au  Fact Sheet for Healthcare Providers: IncredibleEmployment.be  This test is not yet approved or cleared by the [place] FDA and  has been authorized for detection and/or diagnosis of SARS-CoV-2 by FDA under an Emergency Use Authorization (EUA).  This EUA will remain in effect (meaning this test  can be used) for the duration of  the COVID-19 declaration under Section 564(b)(1) of the Act, 21 U.S.C. section 360bbb-3(b)(1), unless the authorization is terminated or revoked sooner.     Influenza A by PCR NEGATIVE NEGATIVE Final   Influenza B by PCR NEGATIVE NEGATIVE Final    Comment: (NOTE) The Xpert Xpress SARS-CoV-2/FLU/RSV plus assay is intended as an aid in the diagnosis of influenza from Nasopharyngeal swab specimens and should not be used as a sole basis for treatment. Nasal washings and aspirates are unacceptable for Xpert Xpress SARS-CoV-2/FLU/RSV testing.  Fact Sheet for Patients: EntrepreneurPulse.com.au  Fact Sheet for Healthcare Providers: IncredibleEmployment.be  This test is not yet approved or cleared by the [place] FDA and has been authorized for detection and/or diagnosis of SARS-CoV-2 by FDA under an Emergency Use Authorization (EUA). This EUA will remain in effect (meaning this test can be used) for the duration of the COVID-19 declaration under Section 564(b)(1) of the Act, 21 U.S.C. section 360bbb-3(b)(1), unless the authorization is terminated or revoked.  Performed at [facility], [address], [place], [place] [address]   Urine Culture     Status: Abnormal   Collection Time: 03/04/21  5:40 PM   Specimen: In/Out Cath Urine  Result Value Ref Range Status   Specimen Description   Final    IN/OUT CATH URINE Performed at [facility], [address], [place], [place] [address]    Special Requests   Final     NONE Performed at [facility], [address] [place], [place] [address]    Culture (A)  Final    <10,000 COLONIES/mL INSIGNIFICANT GROWTH Performed at [facility], [place] [address] [place], [place] [address]    Report Status 03/05/2021 FINAL  Final  MRSA Next Gen by PCR, Nasal     Status: None   Collection Time: 03/06/21  4:40 AM   Specimen: Nasal Mucosa; Nasal Swab  Result Value Ref Range Status   MRSA by PCR Next Gen NOT DETECTED NOT DETECTED Final    Comment: (NOTE) The GeneXpert MRSA Assay (FDA approved for NASAL specimens only), is one component of a comprehensive MRSA colonization surveillance program. It is not intended to diagnose MRSA infection nor to guide or  monitor treatment for MRSA infections. Test performance is not FDA approved in patients less than 76 years old. Performed at [facility], [address] [place], [place] [address]      Labs: BNP (last 3 results) Recent Labs    03/04/21 1414  BNP 440.1*   Basic Metabolic Panel: Recent Labs  Lab 03/10/21 0453 03/11/21 0622 03/12/21 0437 03/13/21 0521 03/14/21 0721  NA 135 135 134* 136 134*  K 4.7 4.6 4.5 4.4 4.6  CL 103 103 101 102 102  CO2 24 21* 21* 24 24  GLUCOSE 260* 133* 243* 200* 327*  BUN 40* 42* 46* 52* 49*  CREATININE 2.07* 2.14* 2.21* 2.34* 2.06*  CALCIUM 8.7* 8.7* 8.7* 8.4* 8.2*  MG 2.0 1.9 1.9 2.1 2.2   Liver Function Tests: No results for input(s): AST, ALT, ALKPHOS, BILITOT, PROT, ALBUMIN in the last 168 hours. No results for input(s): LIPASE, AMYLASE in the last 168 hours. No results for input(s): AMMONIA in the last 168 hours. CBC: Recent Labs  Lab 03/10/21 0453 03/11/21 0622 03/12/21 0437 03/13/21 0521 03/14/21 0721  WBC 8.3 13.9* 13.7* 12.0* 8.2  HGB 10.3* 10.9* 10.4* 10.8* 10.3*  HCT 30.9* 33.7* 30.7* 32.3* 32.0*  MCV 86.6 88.9 86.7 88.5 88.6  PLT 256 343 296 331 367   Cardiac Enzymes: No results for input(s): CKTOTAL, CKMB, CKMBINDEX,  TROPONINI in the last 168 hours. BNP: Invalid input(s): POCBNP CBG: Recent Labs  Lab 03/13/21 0804 03/13/21 1245 03/13/21 1714 03/13/21 2056 03/14/21 0902  GLUCAP 198* 235* 145* 277* 317*   D-Dimer No results for input(s): DDIMER in the last 72 hours. Hgb A1c No results for input(s): HGBA1C in the last 72 hours. Lipid Profile No results for input(s): CHOL, HDL, LDLCALC, TRIG, CHOLHDL, LDLDIRECT in the last 72 hours. Thyroid function studies No results for input(s): TSH, T4TOTAL, T3FREE, THYROIDAB in the last 72 hours.  Invalid input(s): FREET3 Anemia work up No results for input(s): VITAMINB12, FOLATE, FERRITIN, TIBC, IRON, RETICCTPCT in the last 72 hours. Urinalysis    Component Value Date/Time   COLORURINE YELLOW (A) 03/04/2021 1740   APPEARANCEUR CLEAR (A) 03/04/2021 1740   LABSPEC 1.033 (H) 03/04/2021 1740   PHURINE 5.0 03/04/2021 1740   GLUCOSEU 50 (A) 03/04/2021 1740   HGBUR NEGATIVE 03/04/2021 1740   BILIRUBINUR NEGATIVE 03/04/2021 1740   KETONESUR 5 (A) 03/04/2021 1740   PROTEINUR >=300 (A) 03/04/2021 1740   NITRITE NEGATIVE 03/04/2021 1740   LEUKOCYTESUR NEGATIVE 03/04/2021 1740   Sepsis Labs Invalid input(s): PROCALCITONIN,  WBC,  LACTICIDVEN Microbiology Recent Results (from the past 240 hour(s))  Blood culture (routine single)     Status: None   Collection Time: 03/04/21  2:34 PM   Specimen: BLOOD  Result Value Ref Range Status   Specimen Description BLOOD BLOOD LEFT HAND  Final   Special Requests   Final    BOTTLES DRAWN AEROBIC AND ANAEROBIC Blood Culture adequate volume   Culture   Final    NO GROWTH 5 DAYS Performed at [facility], [address] [place], [place] [address]    Report Status 03/09/2021 FINAL  Final  Resp Panel by RT-PCR (Flu A&B, Covid) Nasopharyngeal Swab     Status: Abnormal   Collection Time: 03/04/21  5:40 PM   Specimen: Nasopharyngeal Swab; Nasopharyngeal(NP) swabs in vial transport medium  Result Value Ref  Range Status   SARS Coronavirus 2 by RT PCR POSITIVE (A) NEGATIVE Final    Comment: RESULT CALLED TO, READ BACK  BY AND VERIFIED WITH: GIORGIA HAHLE AT 9935 ON 03/04/21 BY SS (NOTE) SARS-CoV-2 target nucleic acids are DETECTED.  The SARS-CoV-2 RNA is generally detectable in upper respiratory specimens during the acute phase of infection. Positive results are indicative of the presence of the identified virus, but do not rule out bacterial infection or co-infection with other pathogens not detected by the test. Clinical correlation with patient history and other diagnostic information is necessary to determine patient infection status. The expected result is Negative.  Fact Sheet for Patients: EntrepreneurPulse.com.au  Fact Sheet for Healthcare Providers: IncredibleEmployment.be  This test is not yet approved or cleared by the [place] FDA and  has been authorized for detection and/or diagnosis of SARS-CoV-2 by FDA under an Emergency Use Authorization (EUA).  This EUA will remain in effect (meaning this test  can be used) for the duration of  the COVID-19 declaration under Section 564(b)(1) of the Act, 21 U.S.C. section 360bbb-3(b)(1), unless the authorization is terminated or revoked sooner.     Influenza A by PCR NEGATIVE NEGATIVE Final   Influenza B by PCR NEGATIVE NEGATIVE Final    Comment: (NOTE) The Xpert Xpress SARS-CoV-2/FLU/RSV plus assay is intended as an aid in the diagnosis of influenza from Nasopharyngeal swab specimens and should not be used as a sole basis for treatment. Nasal washings and aspirates are unacceptable for Xpert Xpress SARS-CoV-2/FLU/RSV testing.  Fact Sheet for Patients: EntrepreneurPulse.com.au  Fact Sheet for Healthcare Providers: IncredibleEmployment.be  This test is not yet approved or cleared by the [place] FDA and has been authorized for detection and/or  diagnosis of SARS-CoV-2 by FDA under an Emergency Use Authorization (EUA). This EUA will remain in effect (meaning this test can be used) for the duration of the COVID-19 declaration under Section 564(b)(1) of the Act, 21 U.S.C. section 360bbb-3(b)(1), unless the authorization is terminated or revoked.  Performed at [facility], [address], [place], [place] [address]   Urine Culture     Status: Abnormal   Collection Time: 03/04/21  5:40 PM   Specimen: In/Out Cath Urine  Result Value Ref Range Status   Specimen Description   Final    IN/OUT CATH URINE Performed at [facility], [address] [place], [place] [address]    Special Requests   Final    NONE Performed at [facility], [address] [place], [place] [address]    Culture (A)  Final    <10,000 COLONIES/mL INSIGNIFICANT GROWTH Performed at [facility], [place] [address] [place], [place] [address]    Report Status 03/05/2021 FINAL  Final  MRSA Next Gen by PCR, Nasal     Status: None   Collection Time: 03/06/21  4:40 AM   Specimen: Nasal Mucosa; Nasal Swab  Result Value Ref Range Status   MRSA by PCR Next Gen NOT DETECTED NOT DETECTED Final    Comment: (NOTE) The GeneXpert MRSA Assay (FDA approved for NASAL specimens only), is one component of a comprehensive MRSA colonization surveillance program. It is not intended to diagnose MRSA infection nor to guide or monitor treatment for MRSA infections. Test performance is not FDA approved in patients less than 52 years old. Performed at [facility], [address], [place],  [address]      Time coordinating discharge: Over 30 minutes  SIGNED:   Wyvonnia Dusky, MD  [facility] 03/14/2021, 12:42 PM Pager   If 7PM-7AM, please contact night-coverage

## 2021-03-14 NOTE — Progress Notes (Addendum)
Inpatient Diabetes Program Recommendations  AACE/ADA: New Consensus Statement on Inpatient Glycemic Control (2015)  Target Ranges:  Prepandial:   less than 140 mg/dL      Peak postprandial:   less than 180 mg/dL (1-2 hours)      Critically ill patients:  140 - 180 mg/dL   Lab Results  Component Value Date   GLUCAP 317 (H) 03/14/2021   HGBA1C 9.1 (H) 01/30/2021    Review of Glycemic Control Results for Katelyn Arroyo, Katelyn Arroyo (MRN [id number]) as of 03/14/2021 10:31  Ref. Range 03/13/2021 17:14 03/13/2021 20:56 03/14/2021 09:02  Glucose-Capillary Latest Ref Range: 70 - 99 mg/dL 145 (H) 277 (H) 317 (H)    Diabetes history: DM Outpatient Diabetes medications: Lantus 10 units daily, Novolog 4-10 units TID with meals Current orders for Inpatient glycemic control: Semglee 15 units daily, Novolog 0-9 units TID with meals   Inpatient Diabetes Program Recommendations:     Consider ordering Semglee 8 units bid and Novolog 2 units TID (assuming patient is consuming >50% of meal).  Secure chat sent to md  Thanks, Bronson Curb, MSN, RNC-OB Diabetes Coordinator [phone] (8a-5p)

## 2021-03-14 NOTE — Care Management Important Message (Signed)
Important Message  Patient Details  Name: Katelyn Arroyo MRN: [id number] Date of Birth: 1928-05-26   Medicare Important Message Given:  Yes  I reviewed the Important Message from Medicare with the patient's daughter, Katelyn Arroyo ([phone]) who has been working with the [facility] to make arrangements to have her mother transported to [place]. She is in agreement with the discharge plan for tomorrow. I wished her mother the best and thanked her for her time.  Katelyn Arroyo 03/14/2021, 2:36 PM

## 2021-03-15 LAB — GLUCOSE, CAPILLARY
Glucose-Capillary: 335 mg/dL — ABNORMAL HIGH (ref 70–99)
Glucose-Capillary: 224 mg/dL — ABNORMAL HIGH (ref 70–99)
Glucose-Capillary: 132 mg/dL — ABNORMAL HIGH (ref 70–99)
Glucose-Capillary: 39 mg/dL — CL (ref 70–99)
Glucose-Capillary: 193 mg/dL — ABNORMAL HIGH (ref 70–99)

## 2021-03-15 LAB — CBC
HCT: 31.5 % — ABNORMAL LOW (ref 36.0–46.0)
Hemoglobin: 10.2 g/dL — ABNORMAL LOW (ref 12.0–15.0)
MCH: 28.8 pg (ref 26.0–34.0)
MCHC: 32.4 g/dL (ref 30.0–36.0)
MCV: 89 fL (ref 80.0–100.0)
Platelets: 367 10*3/uL (ref 150–400)
RBC: 3.54 MIL/uL — ABNORMAL LOW (ref 3.87–5.11)
RDW: 13.3 % (ref 11.5–15.5)
WBC: 8.4 10*3/uL (ref 4.0–10.5)
nRBC: 0 % (ref 0.0–0.2)

## 2021-03-15 LAB — BASIC METABOLIC PANEL
Anion gap: 9 (ref 5–15)
BUN: 57 mg/dL — ABNORMAL HIGH (ref 8–23)
CO2: 23 mmol/L (ref 22–32)
Calcium: 8.3 mg/dL — ABNORMAL LOW (ref 8.9–10.3)
Chloride: 101 mmol/L (ref 98–111)
Creatinine, Ser: 2.57 mg/dL — ABNORMAL HIGH (ref 0.44–1.00)
GFR, Estimated: 17 mL/min — ABNORMAL LOW (ref >60)
Glucose, Bld: 330 mg/dL — ABNORMAL HIGH (ref 70–99)
Potassium: 4.8 mmol/L (ref 3.5–5.1)
Sodium: 133 mmol/L — ABNORMAL LOW (ref 135–145)

## 2021-03-15 LAB — MAGNESIUM: Magnesium: 2.3 mg/dL (ref 1.7–2.4)

## 2021-03-15 NOTE — Progress Notes (Signed)
Cbg 39 gave d50 will recheck cbg in 15 minutes pt asymptomatic md aware.

## 2021-03-15 NOTE — TOC Progression Note (Signed)
Transition of Care [facility]) - Progression Note    Patient Details  Name: Katelyn Arroyo MRN: [id number] Date of Birth: 01-15-1929  Transition of Care [facility]) CM/SW [place], RN Phone Number: 03/15/2021, 9:23 AM  Clinical Narrative:   Faxed DC summary and SNF transfer report to [phone] [facility], Patient to be transported by EMS to pick up at 10 PM, will have 18 hours of medications to transport with her         Expected Discharge Plan and Services                                                 Social Determinants of Health (SDOH) Interventions    Readmission Risk Interventions No flowsheet data found.

## 2021-03-15 NOTE — Progress Notes (Signed)
Physical Therapy Treatment Patient Details Name: Katelyn Arroyo MRN: [id number] DOB: Aug 30, 1928 Today's Date: 03/15/2021   History of Present Illness Pt is an 87 y/o F with PMH: IDDM, hypothyroidism, dementia, and depression. Pt presents from facility [facility]) after fall/syncopal episode, found to have minimally displaced right superior pubic ramus fracture and right sacral ala fracture.    PT Comments    Pt in bed watching Let's make a Deal on entry, clam, alert, pleasant. Pt agreeable to session. Again, chronic cognitive changes limit degree and volume of report from the patient since prior visit, however author attempts to orient patient to her HPI, given her confused facial expression after being asked about pain. Pt assisted to EOB with max-totalA +1, despite attempts to participate and good accuracy in following simple cues, severe weakness precludes ability to put forth much force. At EOB pt struggles with obtaining independent short sitting, but is able to do so for ~15 seconds, but steadily melts into a hypotonic, somnolent state, begins reportign with urgency a need to lie back down, but is unable to provide more specific reasoning- author strongly suspects repeat episode of orthostasis given her signs of dysautonomia 2 days prior. Pt returned to supine, then assisted with rolling in bed for linen change. Pt positioned in recliner position at EOS, bed angled toward window view, HOB at 45 degrees, pt sipping a cup of hot black coffee from RN.     Recommendations for follow up therapy are one component of a multi-disciplinary discharge planning process, led by the attending physician.  Recommendations may be updated based on patient status, additional functional criteria and insurance authorization.  Follow Up Recommendations  Skilled nursing-short term rehab (<3 hours/day)     Assistance Recommended at Discharge Frequent or constant Supervision/Assistance  Equipment Recommendations   None recommended by PT    Recommendations for Other Services       Precautions / Restrictions Precautions Precautions: Fall Restrictions Weight Bearing Restrictions: No RLE Weight Bearing: Weight bearing as tolerated     Mobility  Bed Mobility Overal bed mobility: Needs Assistance Bed Mobility: Supine to Sit;Sit to Supine;Rolling Rolling: Max assist (by far the most clearly painful thing done, needs much help)   Supine to sit: Total assist Sit to supine: Total assist   General bed mobility comments: atempts to assists but severely weak and deconditioned    Transfers Overall transfer level:  (deferred, does not tolerate sitting EOB fro more than 2 minutes; unsafe to attempt transfer given signs of dysautonomia and orthostasis 2 days prior)                      Ambulation/Gait                 Stairs             Wheelchair Mobility    Modified Rankin (Stroke Patients Only)       Balance                                            Cognition Arousal/Alertness: Awake/alert Behavior During Therapy: WFL for tasks assessed/performed Overall Cognitive Status: History of cognitive impairments - at baseline                                 General Comments:  Oriented to self only; follows most simple one step commands with increased time.        Exercises      General Comments        Pertinent Vitals/Pain      Home Living                          Prior Function            PT Goals (current goals can now be found in the care plan section) Acute Rehab PT Goals Patient Stated Goal: to walk PT Goal Formulation: Patient unable to participate in goal setting Progress towards PT goals: Not progressing toward goals - comment    Frequency    Min 2X/week      PT Plan Current plan remains appropriate    Co-evaluation              AM-PAC PT "6 Clicks" Mobility   Outcome Measure   Help needed turning from your back to your side while in a flat bed without using bedrails?: Total Help needed moving from lying on your back to sitting on the side of a flat bed without using bedrails?: Total Help needed moving to and from a bed to a chair (including a wheelchair)?: Total Help needed standing up from a chair using your arms (e.g., wheelchair or bedside chair)?: Total Help needed to walk in hospital room?: Total Help needed climbing 3-5 steps with a railing? : Total 6 Click Score: 6    End of Session   Activity Tolerance: Patient limited by fatigue;Patient tolerated treatment well Patient left: in bed;with SCD's reapplied;with call bell/phone within reach;with bed alarm set;Other (comment) (HOB at45 degrees)   PT Visit Diagnosis: Muscle weakness (generalized) (M62.81);Difficulty in walking, not elsewhere classified (R26.2);Pain     Time: 1034-1100 PT Time Calculation (min) (ACUTE ONLY): 26 min  Charges:  $Therapeutic Activity: 23-37 mins                    11:27 AM, 03/15/21 Etta Grandchild, PT, DPT Physical Therapist - [facility]  [phone] ([place])     Nelissa Bolduc C 03/15/2021, 11:18 AM

## 2021-03-15 NOTE — Progress Notes (Signed)
Recheck cbg 132

## 2021-03-15 NOTE — Progress Notes (Signed)
Pt was discharged as ordered. Pt received pain medication. Personal care done and new purewick placed for transportation. Pt left with all her belongings including medication. Transport staff provided with care instructions pertinent to pt's safe care.

## 2021-04-11 DEATH — deceased

## 2021-04-15 LAB — BLOOD GAS, VENOUS
Acid-Base Excess: 1.3 mmol/L (ref 0.0–2.0)
Bicarbonate: 27.6 mmol/L (ref 20.0–28.0)
O2 Saturation: 48.8 %
Patient temperature: 37
pCO2, Ven: 50 mmHg (ref 44.0–60.0)
pH, Ven: 7.35 (ref 7.250–7.430)

## 2022-10-25 IMAGING — CT CT L SPINE W/O CM
2 series · 15 of 29 positions shown, 16 images · IV contrast (agent unspecified)
Comparison: None

CLINICAL DATA: Fall

EXAM:
CT Thoracic and Lumbar spine without contrast
TECHNIQUE: Multiplanar CT images of the thoracic and lumbar spine were
reconstructed from contemporary CT of the Chest, Abdomen, and Pelvis
CONTRAST:  No dissection

[Series 3: l cornoal bone · coronal · 0.33mm/px · 3 of 121 slices shown]
[im 49/121  bone]
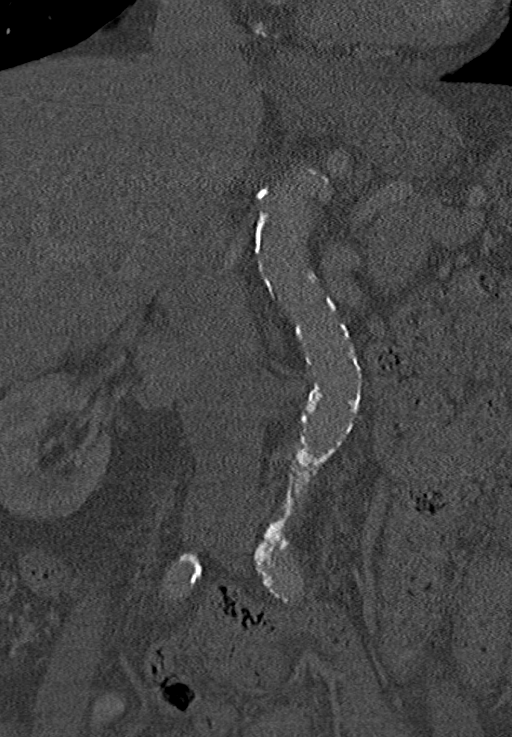
[im 52/121  bone]
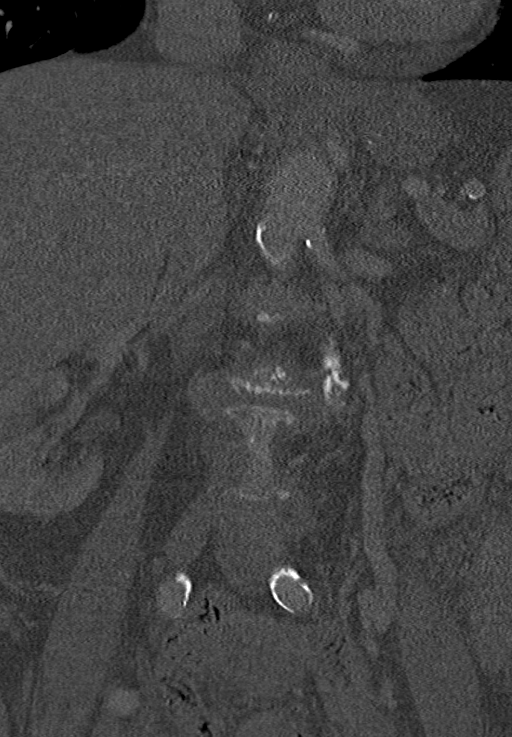
[im 69/121  bone]
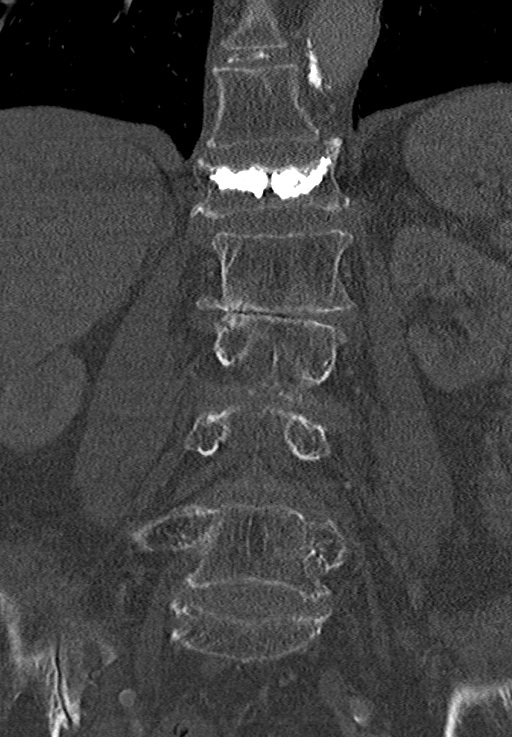

[Series 4: l sag bone · sagittal · 0.47mm/px · 12 of 86 slices shown, 13 images]
[im 8/86  bone]
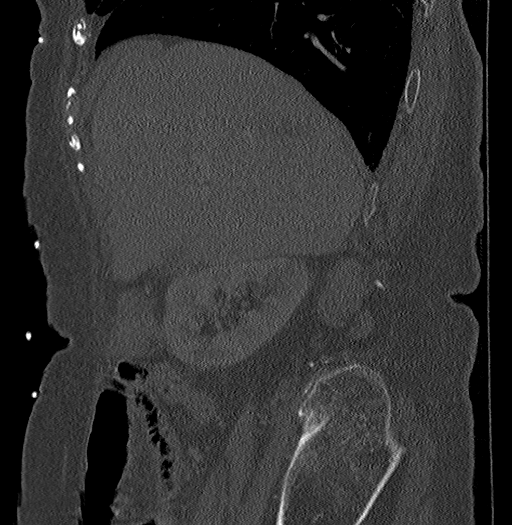
[im 15/86  bone]
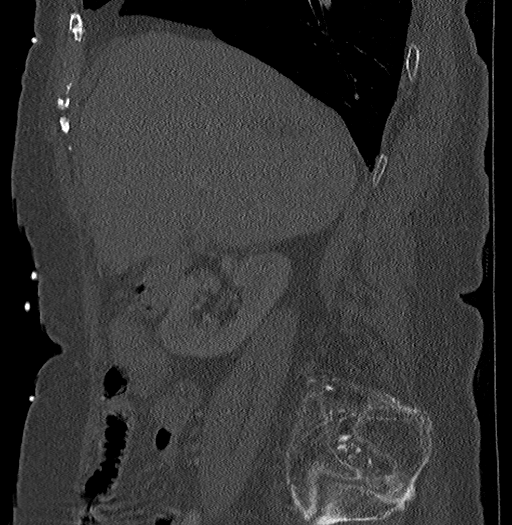
[im 22/86  bone]
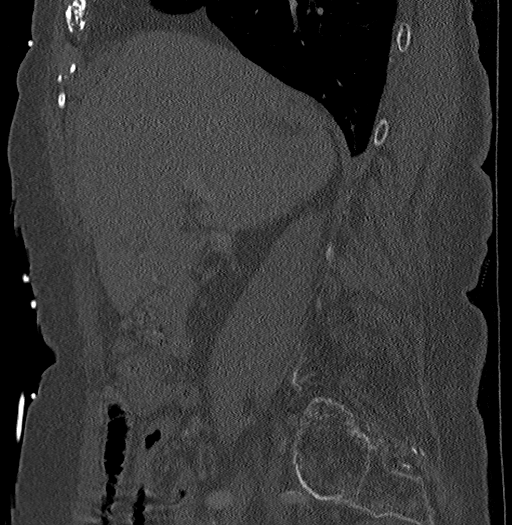
[im 29/86  bone]
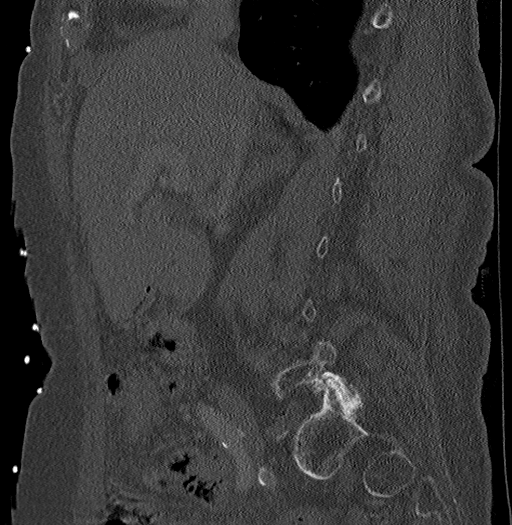
[im 35/86  soft-tissue]
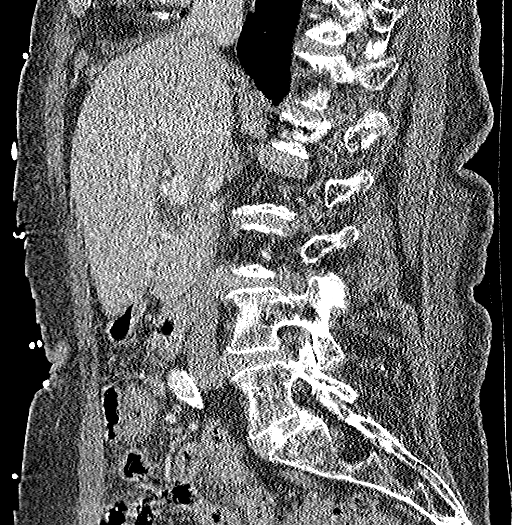
[im 35/86  bone]
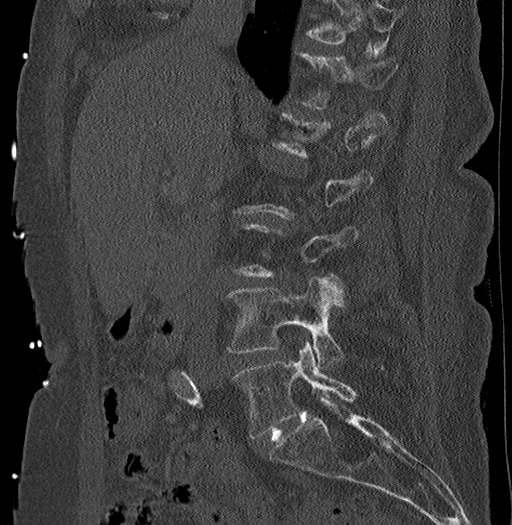
[im 36/86  bone]
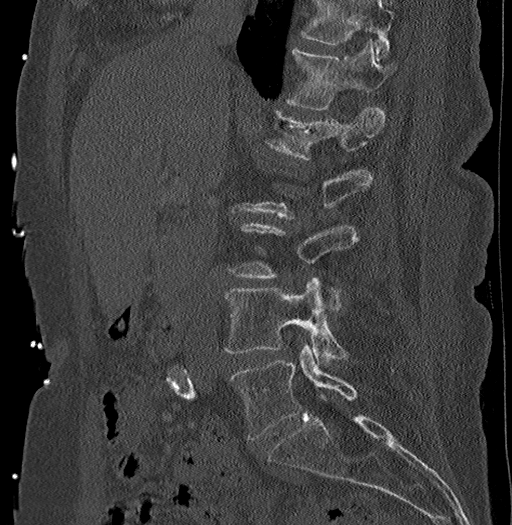
[im 50/86  bone]
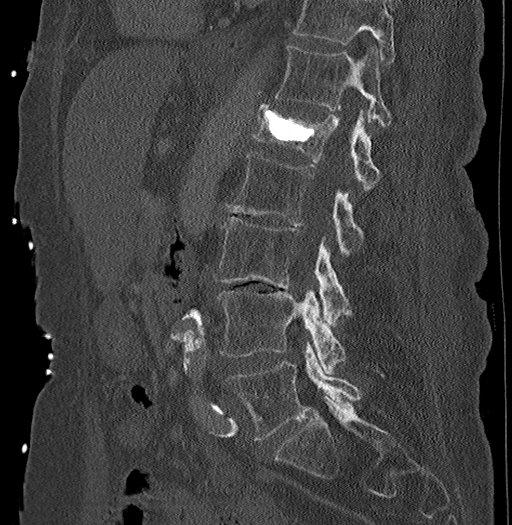
[im 52/86  bone]
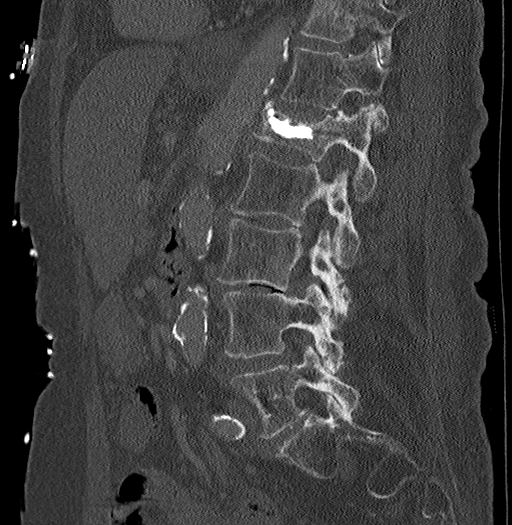
[im 57/86  bone]
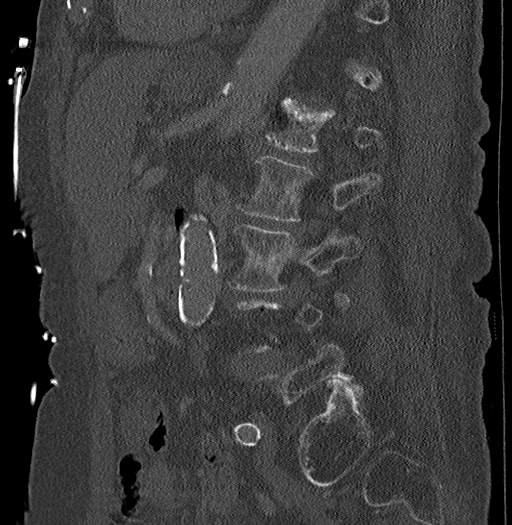
[im 64/86  bone]
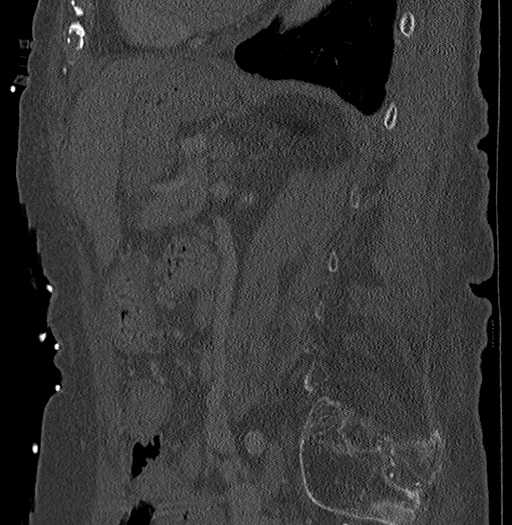
[im 71/86  bone]
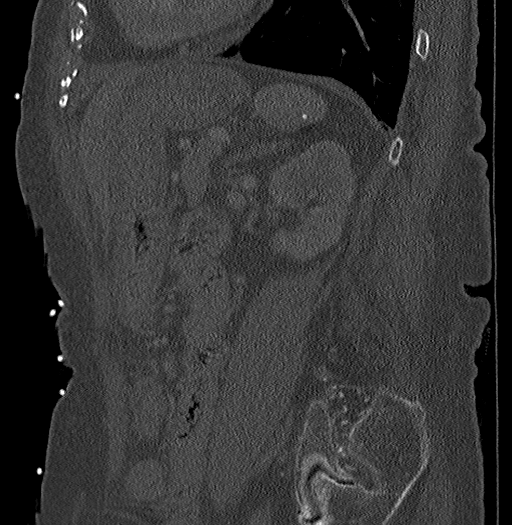
[im 78/86  bone]
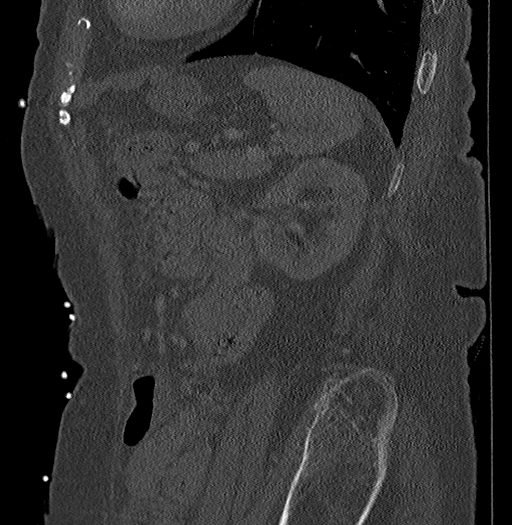

[15 of 29 positions shown; findings below may reference images not displayed]

FINDINGS: CT THORACIC SPINE FINDINGS

Alignment: Physiologic

Vertebrae: There is an age-indeterminate central compression
deformity of T2 with approximately 30% height loss. No bony
retropulsion. No other evidence of thoracic spine fracture. Diffuse
osteopenia. No aggressive osseous lesion/suspicious lytic or blastic
lesions.

Paraspinal and other soft tissues: Reported separately.

Disc levels: Multilevel degenerate endplate changes and disc
disease, most prominent at T2-T3. C7-T1 degenerative disc disease
and facet arthropathy.

CT LUMBAR SPINE FINDINGS

Segmentation: Normal

Alignment: Trace anterolisthesis at L3-L4.

Vertebrae: Osteopenia. Prior L1 burst fracture with postprocedural
changes of vertebroplasty. There is 5 mm bony retropulsion of the
superior endplate which is presumably chronic. No priors for
comparison. No other evidence of fracture.

Paraspinal and other soft tissues: Reported separately

Disc levels: There is multilevel degenerative disc disease, most
severe at L2-L3 and L3-L4. There is moderate multilevel facet
arthropathy. There is multilevel disc bulging with likely very
degrees of mild-to-moderate spinal canal and neural foraminal
narrowing.
IMPRESSION: THORACIC SPINE CT

Age-indeterminate central compression deformity of T2 with
approximately 30% height loss. No bony retropulsion.

Multilevel degenerative disc disease, most prominent at T2-T3.

LUMBAR SPINE CT

Postprocedural changes of vertebroplasty for a prior L1 burst
fracture. There is 5 mm bony retropulsion of the superior endplate
which is presumably chronic, no priors for comparison. No other
lumbar spine fracture.

Multilevel degenerative disc disease and facet arthropathy with disc
bulging, most severe at L2-L3 and L3-L4. Likely varying degrees of
mild to moderate spinal canal and neural foraminal narrowing.

## 2022-10-25 IMAGING — CT CT CERVICAL SPINE W/O CM
3 of 4 series · 12 of 33 positions shown, 14 images · non-contrast
Comparison: None.

CLINICAL DATA: Neck trauma (Age >= 65y)

EXAM:
CT CERVICAL SPINE WITHOUT CONTRAST
TECHNIQUE: Multidetector CT imaging of the cervical spine was performed without
intravenous contrast. Multiplanar CT image reconstructions were also
generated.

[Series 4: sagittal bone · sagittal · 0.26mm/px · 5 of 75 slices shown, 6 images]
[im 25/75  bone]
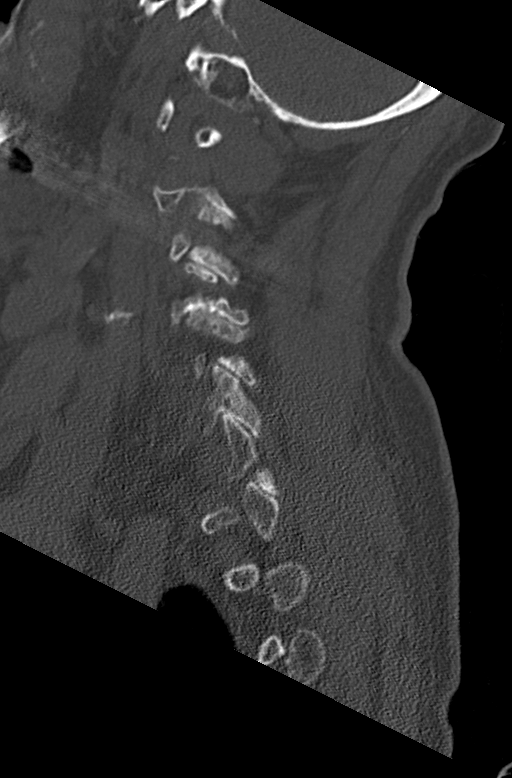
[im 31/75  bone]
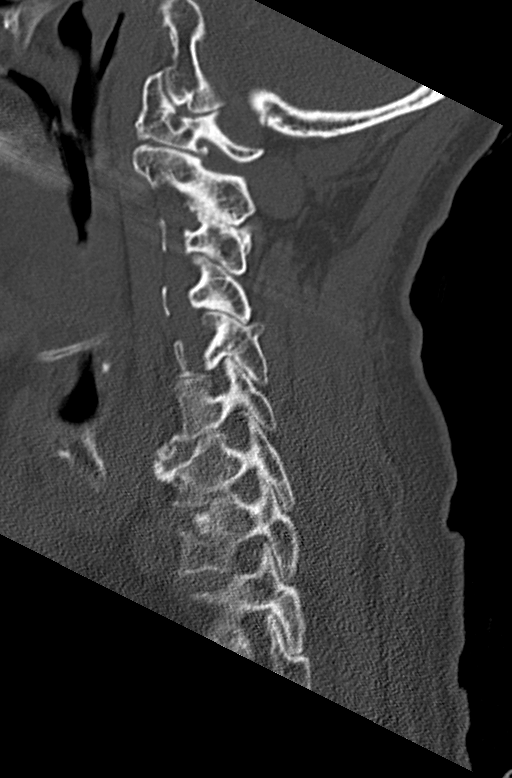
[im 38/75  soft-tissue]
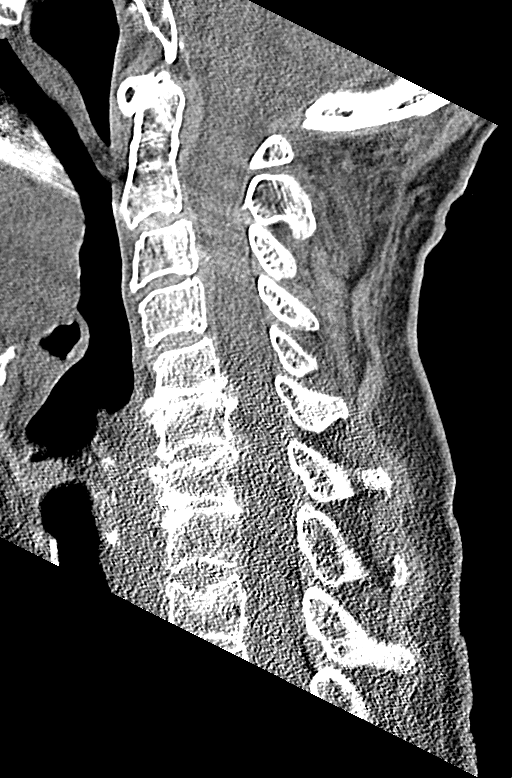
[im 38/75  bone]
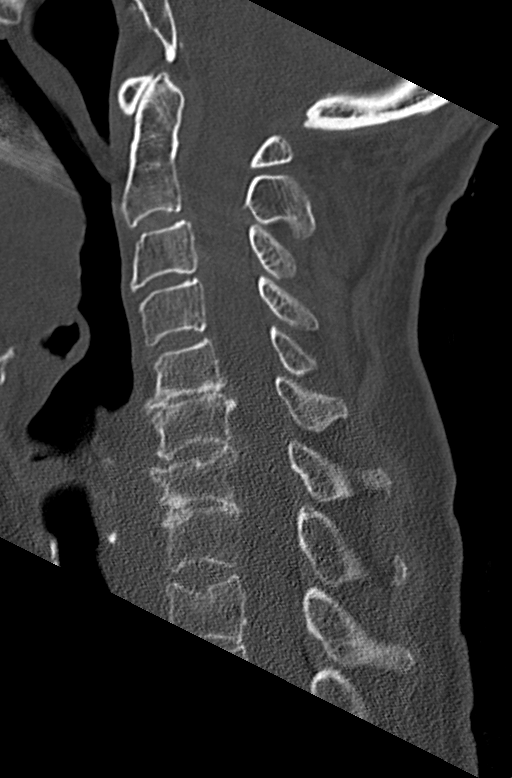
[im 44/75  bone]
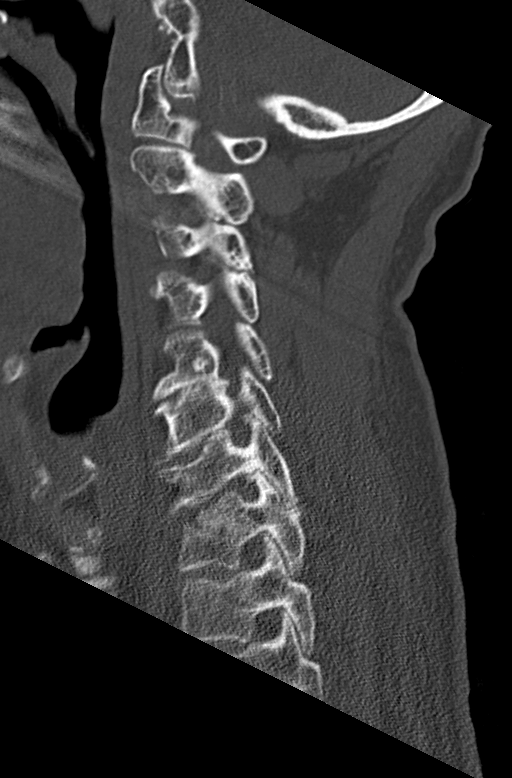
[im 50/75  bone]
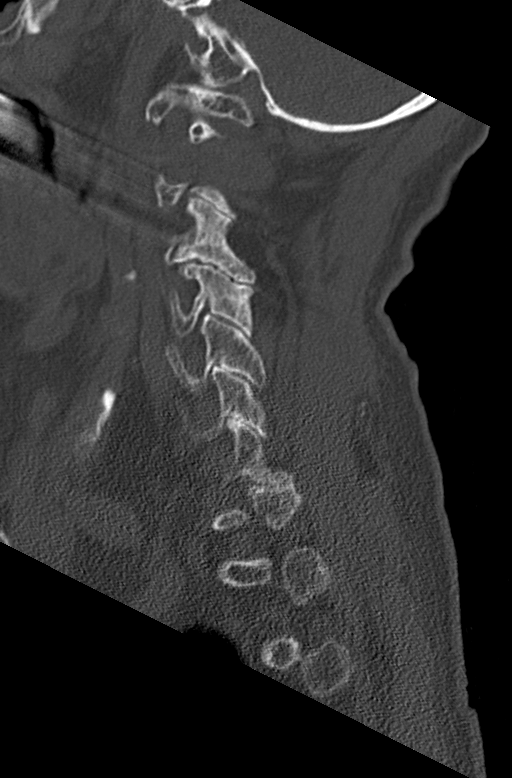

[Series 5: coronal bone · coronal · 0.29mm/px · 3 of 64 slices shown]
[im 16/64  bone]
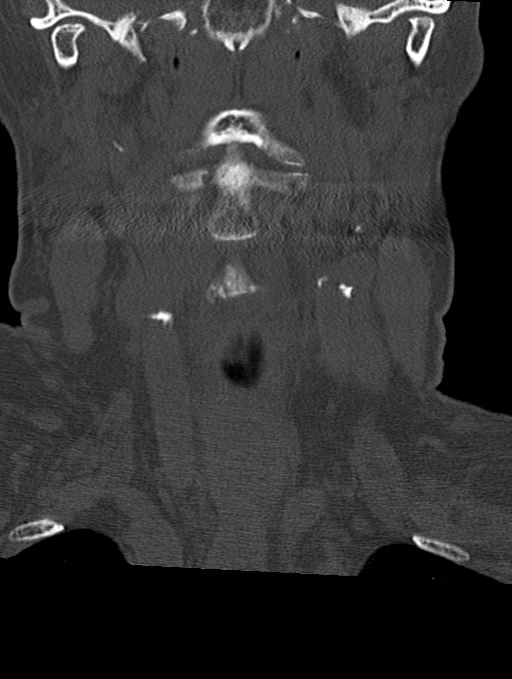
[im 27/64  bone]
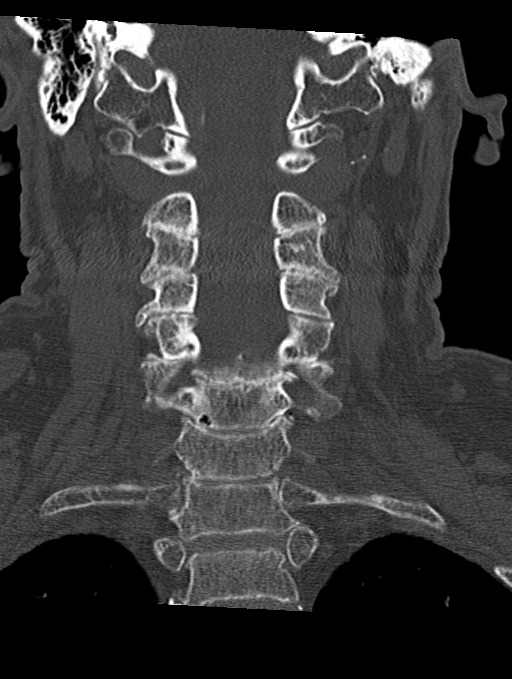
[im 37/64  bone]
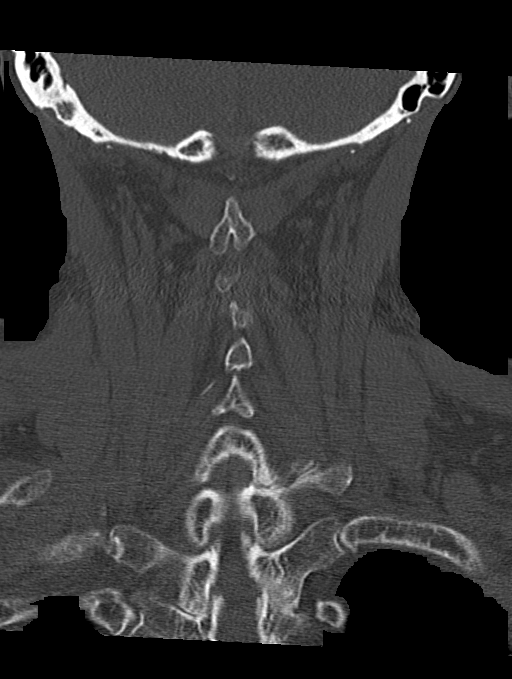

[Series 6: orthogonal bone · axial · 0.26mm/px · z∈[-291,-166]mm · 4 of 100 slices shown, 5 images]
[im 15/100  soft-tissue]
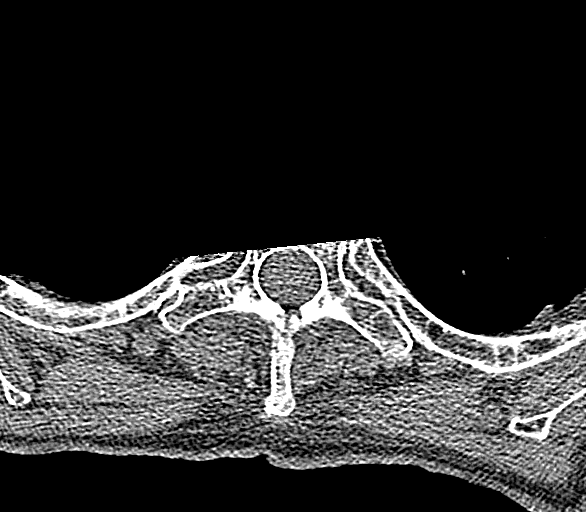
[im 15/100  bone]
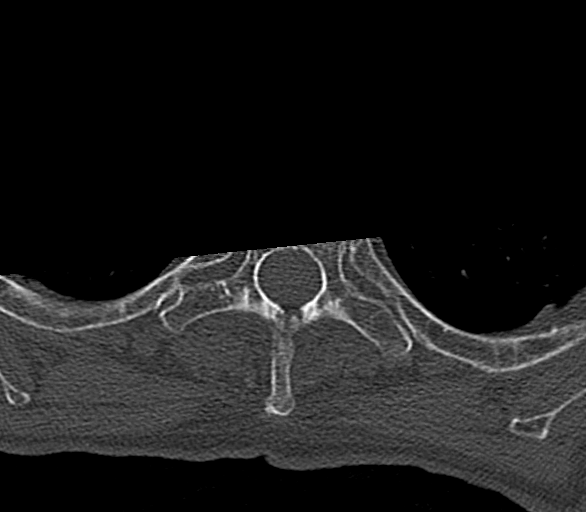
[im 43/100  bone]
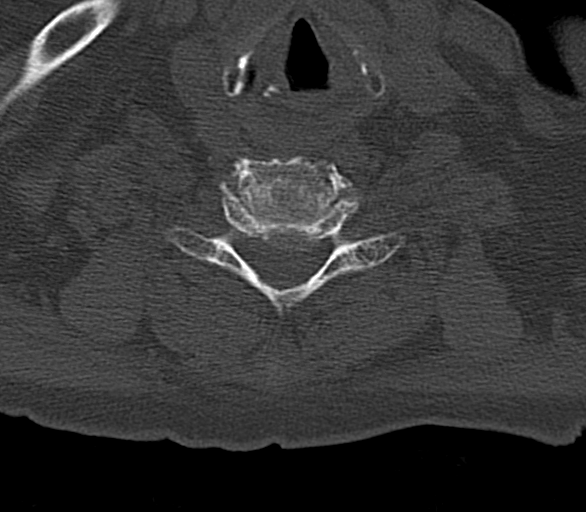
[im 57/100  bone]
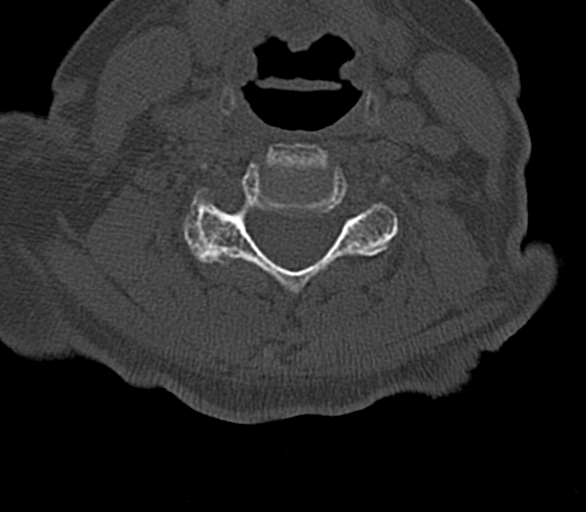
[im 85/100  bone]
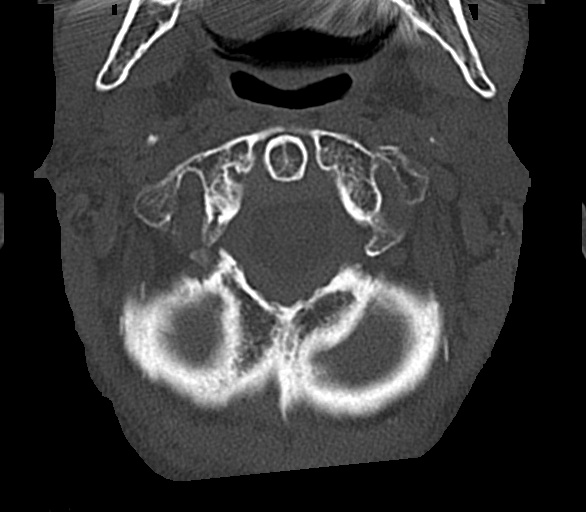

[12 of 33 positions shown; findings below may reference images not displayed]

FINDINGS: Alignment: Straightening of the normal cervical lordosis. Trace
anterolisthesis at C4-C5.

Skull base and vertebrae: There is no evidence of acute cervical
spine fracture. Mild anterior height loss of C7 which is likely
chronic. There is no aggressive osseous lesion.

Soft tissues and spinal canal: No prevertebral fluid or swelling. No
visible canal hematoma.

Disc levels: Multilevel degenerative disc disease, severe at C5-C6
and moderate at C6-C7 and C7-T1. There is multilevel facet
arthropathy bilaterally.

Upper chest: Please see same-day separately dictated CT of the
chest.

Other: Atrophic thyroid.
IMPRESSION: No acute cervical spine fracture. Mild anterior height loss of C7
which is favored to be chronic.

Multilevel degenerative disc disease, worst from C5-T1.

## 2022-10-25 IMAGING — DX DG CHEST 1V PORT
1 series · 1 of 1 positions shown · non-contrast
Comparison: Portable exam 9294 hours compared to 01/30/2021

CLINICAL DATA: Altered mental status questionable sepsis

EXAM:
PORTABLE CHEST 1 VIEW

[chest ap]
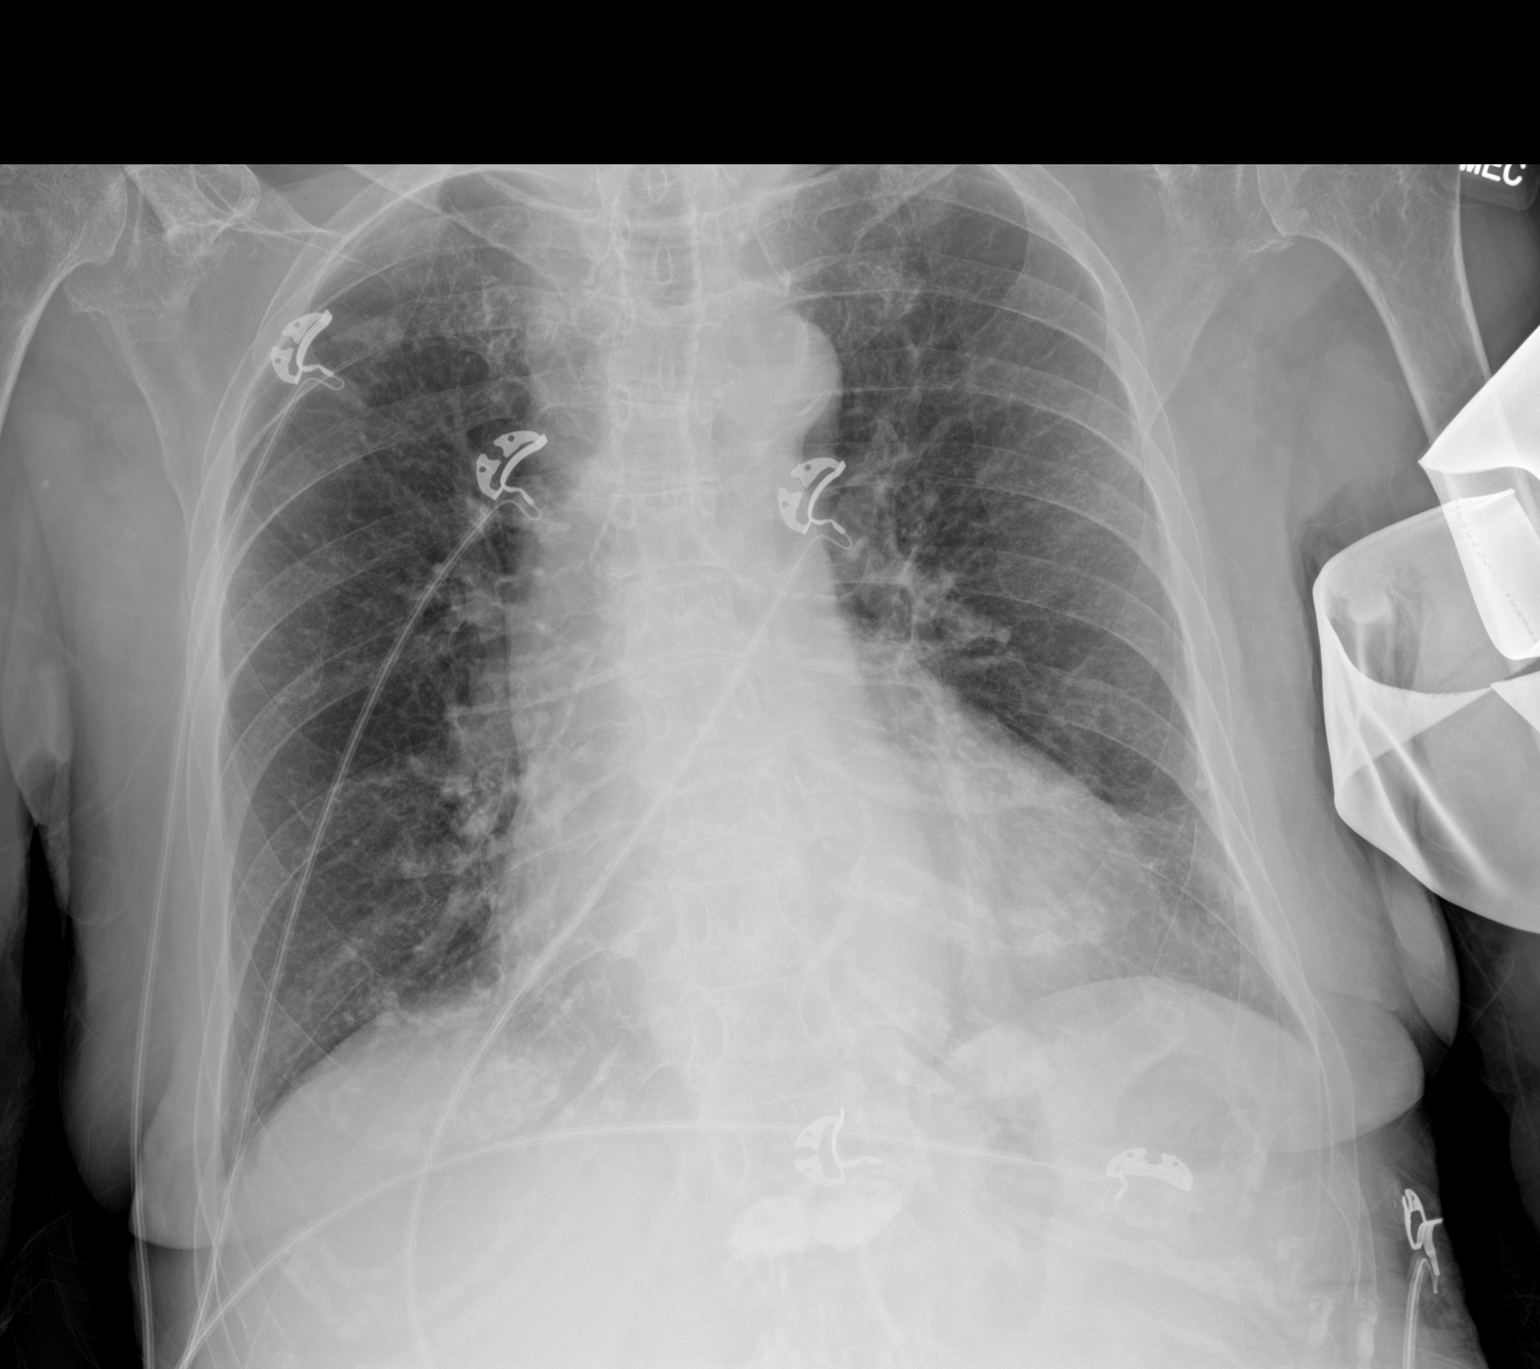

[1 of 1 positions shown; findings below may reference images not displayed]

FINDINGS: Enlargement of cardiac silhouette.

Mediastinal contours and pulmonary vascularity normal.

Atherosclerotic calcification aorta.

Chronic bronchitic changes.

Lungs clear.

No pulmonary infiltrate, pleural effusion, or pneumothorax.

Small calcified granulomata at lower LEFT lung noted.

Diffuse osseous demineralization with prior vertebroplasty at
question L1.
IMPRESSION: Enlargement of cardiac silhouette and chronic bronchitic changes.

No acute abnormalities.

Aortic Atherosclerosis ([id number]-E48.8).
